# Patient Record
Sex: Male | Born: 1970 | Race: Black or African American | Hispanic: No | Marital: Single | Smoking: Current some day smoker
Health system: Southern US, Community
[De-identification: ages and names within clinical notes are randomized; demographics above are authoritative.]

## PROBLEM LIST (undated history)

## (undated) DIAGNOSIS — I639 Cerebral infarction, unspecified: Secondary | ICD-10-CM

## (undated) DIAGNOSIS — F32A Depression, unspecified: Secondary | ICD-10-CM

## (undated) DIAGNOSIS — F329 Major depressive disorder, single episode, unspecified: Secondary | ICD-10-CM

## (undated) DIAGNOSIS — G4730 Sleep apnea, unspecified: Secondary | ICD-10-CM

## (undated) DIAGNOSIS — F319 Bipolar disorder, unspecified: Secondary | ICD-10-CM

## (undated) DIAGNOSIS — D689 Coagulation defect, unspecified: Secondary | ICD-10-CM

## (undated) DIAGNOSIS — M1990 Unspecified osteoarthritis, unspecified site: Secondary | ICD-10-CM

## (undated) HISTORY — DX: Bipolar disorder, unspecified: F31.9

## (undated) HISTORY — DX: Cerebral infarction, unspecified: I63.9

## (undated) HISTORY — DX: Unspecified osteoarthritis, unspecified site: M19.90

## (undated) HISTORY — PX: EYE SURGERY: SHX253

## (undated) HISTORY — DX: Sleep apnea, unspecified: G47.30

## (undated) HISTORY — PX: OTHER SURGICAL HISTORY: SHX169

## (undated) HISTORY — DX: Coagulation defect, unspecified: D68.9

---

## 2005-06-11 ENCOUNTER — Ambulatory Visit: Payer: Self-pay

## 2009-03-02 ENCOUNTER — Emergency Department (HOSPITAL_COMMUNITY): Admission: EM | Admit: 2009-03-02 | Discharge: 2009-03-02 | Payer: Self-pay | Admitting: Family Medicine

## 2009-03-20 ENCOUNTER — Emergency Department (HOSPITAL_COMMUNITY): Admission: EM | Admit: 2009-03-20 | Discharge: 2009-03-20 | Payer: Self-pay | Admitting: Family Medicine

## 2009-04-12 ENCOUNTER — Emergency Department (HOSPITAL_COMMUNITY): Admission: EM | Admit: 2009-04-12 | Discharge: 2009-04-12 | Payer: Self-pay | Admitting: Emergency Medicine

## 2009-04-17 ENCOUNTER — Emergency Department (HOSPITAL_COMMUNITY): Admission: EM | Admit: 2009-04-17 | Discharge: 2009-04-17 | Payer: Self-pay | Admitting: Emergency Medicine

## 2009-11-29 ENCOUNTER — Emergency Department (HOSPITAL_COMMUNITY): Admission: EM | Admit: 2009-11-29 | Discharge: 2009-11-29 | Payer: Self-pay | Admitting: Family Medicine

## 2010-02-24 ENCOUNTER — Emergency Department (HOSPITAL_COMMUNITY): Admission: EM | Admit: 2010-02-24 | Discharge: 2010-02-24 | Payer: Self-pay | Admitting: Family Medicine

## 2011-01-26 LAB — FECAL LACTOFERRIN, QUANT: Fecal Lactoferrin: POSITIVE

## 2011-01-26 LAB — STOOL CULTURE

## 2011-02-19 LAB — POCT I-STAT, CHEM 8
BUN: 18 mg/dL (ref 6–23)
Creatinine, Ser: 1.3 mg/dL (ref 0.4–1.5)
Potassium: 4.2 mEq/L (ref 3.5–5.1)
Sodium: 138 mEq/L (ref 135–145)

## 2011-08-02 ENCOUNTER — Emergency Department (HOSPITAL_COMMUNITY)
Admission: EM | Admit: 2011-08-02 | Discharge: 2011-08-02 | Disposition: A | Discharge status: Home / Self Care | Payer: Medicare Other | Attending: Emergency Medicine | Admitting: Emergency Medicine

## 2011-08-02 ENCOUNTER — Emergency Department (HOSPITAL_COMMUNITY): Payer: Medicare Other

## 2011-08-02 DIAGNOSIS — W268XXA Contact with other sharp object(s), not elsewhere classified, initial encounter: Secondary | ICD-10-CM | POA: Insufficient documentation

## 2011-08-02 DIAGNOSIS — T148XXA Other injury of unspecified body region, initial encounter: Secondary | ICD-10-CM | POA: Insufficient documentation

## 2011-08-04 ENCOUNTER — Inpatient Hospital Stay (INDEPENDENT_AMBULATORY_CARE_PROVIDER_SITE_OTHER)
Admission: RE | Admit: 2011-08-04 | Discharge: 2011-08-04 | Disposition: A | Discharge status: Home / Self Care | Payer: Medicare Other | Source: Ambulatory Visit | Attending: Family Medicine | Admitting: Family Medicine

## 2011-08-04 DIAGNOSIS — T148XXA Other injury of unspecified body region, initial encounter: Secondary | ICD-10-CM

## 2011-08-09 ENCOUNTER — Inpatient Hospital Stay (INDEPENDENT_AMBULATORY_CARE_PROVIDER_SITE_OTHER)
Admission: RE | Admit: 2011-08-09 | Discharge: 2011-08-09 | Disposition: A | Discharge status: Home / Self Care | Payer: Medicare Other | Source: Ambulatory Visit | Attending: Emergency Medicine | Admitting: Emergency Medicine

## 2011-08-09 DIAGNOSIS — T148XXA Other injury of unspecified body region, initial encounter: Secondary | ICD-10-CM

## 2011-08-13 ENCOUNTER — Inpatient Hospital Stay (INDEPENDENT_AMBULATORY_CARE_PROVIDER_SITE_OTHER)
Admission: RE | Admit: 2011-08-13 | Discharge: 2011-08-13 | Disposition: A | Discharge status: Home / Self Care | Payer: Medicare Other | Source: Ambulatory Visit | Attending: Family Medicine | Admitting: Family Medicine

## 2011-08-13 DIAGNOSIS — Z4802 Encounter for removal of sutures: Secondary | ICD-10-CM

## 2015-05-07 ENCOUNTER — Encounter (HOSPITAL_COMMUNITY): Payer: Self-pay

## 2015-05-07 ENCOUNTER — Emergency Department (HOSPITAL_COMMUNITY)
Admission: EM | Admit: 2015-05-07 | Discharge: 2015-05-07 | Disposition: A | Discharge status: Home / Self Care | Payer: Medicare HMO | Attending: Emergency Medicine | Admitting: Emergency Medicine

## 2015-05-07 DIAGNOSIS — R55 Syncope and collapse: Secondary | ICD-10-CM | POA: Insufficient documentation

## 2015-05-07 DIAGNOSIS — R110 Nausea: Secondary | ICD-10-CM | POA: Diagnosis not present

## 2015-05-07 DIAGNOSIS — R0602 Shortness of breath: Secondary | ICD-10-CM | POA: Insufficient documentation

## 2015-05-07 DIAGNOSIS — F329 Major depressive disorder, single episode, unspecified: Secondary | ICD-10-CM | POA: Diagnosis not present

## 2015-05-07 DIAGNOSIS — Z720 Tobacco use: Secondary | ICD-10-CM | POA: Diagnosis not present

## 2015-05-07 DIAGNOSIS — Z79899 Other long term (current) drug therapy: Secondary | ICD-10-CM | POA: Diagnosis not present

## 2015-05-07 HISTORY — DX: Depression, unspecified: F32.A

## 2015-05-07 HISTORY — DX: Major depressive disorder, single episode, unspecified: F32.9

## 2015-05-07 LAB — BASIC METABOLIC PANEL
Anion gap: 5 (ref 5–15)
BUN: 15 mg/dL (ref 6–20)
CO2: 25 mmol/L (ref 22–32)
Calcium: 8.2 mg/dL — ABNORMAL LOW (ref 8.9–10.3)
Chloride: 108 mmol/L (ref 101–111)
Creatinine, Ser: 1.16 mg/dL (ref 0.61–1.24)
GFR calc Af Amer: >60 mL/min (ref >60)
GFR calc non Af Amer: >60 mL/min (ref >60)
Glucose, Bld: 79 mg/dL (ref 65–99)
Potassium: 4 mmol/L (ref 3.5–5.1)
Sodium: 138 mmol/L (ref 135–145)

## 2015-05-07 LAB — CBC WITH DIFFERENTIAL/PLATELET
Basophils Absolute: 0 10*3/uL (ref 0.0–0.1)
Basophils Relative: 0 % (ref 0–1)
Eosinophils Absolute: 0.1 10*3/uL (ref 0.0–0.7)
Eosinophils Relative: 2 % (ref 0–5)
HCT: 41 % (ref 39.0–52.0)
Hemoglobin: 13.6 g/dL (ref 13.0–17.0)
Lymphocytes Relative: 35 % (ref 12–46)
Lymphs Abs: 2 10*3/uL (ref 0.7–4.0)
MCH: 30.8 pg (ref 26.0–34.0)
MCHC: 33.2 g/dL (ref 30.0–36.0)
MCV: 92.8 fL (ref 78.0–100.0)
Monocytes Absolute: 0.6 10*3/uL (ref 0.1–1.0)
Monocytes Relative: 9 % (ref 3–12)
Neutro Abs: 3.1 10*3/uL (ref 1.7–7.7)
Neutrophils Relative %: 54 % (ref 43–77)
Platelets: 155 10*3/uL (ref 150–400)
RBC: 4.42 MIL/uL (ref 4.22–5.81)
RDW: 13.1 % (ref 11.5–15.5)
WBC: 5.8 10*3/uL (ref 4.0–10.5)

## 2015-05-07 LAB — MAGNESIUM: Magnesium: 2 mg/dL (ref 1.7–2.4)

## 2015-05-07 LAB — TROPONIN I: Troponin I: <0.03 ng/mL (ref <0.031)

## 2015-05-07 MED ORDER — SODIUM CHLORIDE 0.9 % IV BOLUS (SEPSIS)
1000.0000 mL | Freq: Once | INTRAVENOUS | Status: AC
  Administered 2015-05-07: 1000 mL via INTRAVENOUS

## 2015-05-07 NOTE — ED Notes (Signed)
Patient c/o having dizziness x 1 year and is now having syncopal episodes x 4 days. Patient states he starts to get nauseated, SOB, lightheaded and then passes out. Patient denies any CP. Patient also c/o dysuria and states tht he is having bilateral flank pain and bilateral lower abdominal pain.

## 2015-05-07 NOTE — Discharge Instructions (Signed)
Syncope °Syncope means a person passes out (faints). The person usually wakes up in less than 5 minutes. It is important to seek medical care for syncope. °HOME CARE °· Have someone stay with you until you feel normal. °· Do not drive, use machines, or play sports until your doctor says it is okay. °· Keep all doctor visits as told. °· Lie down when you feel like you might pass out. Take deep breaths. Wait until you feel normal before standing up. °· Drink enough fluids to keep your pee (urine) clear or pale yellow. °· If you take blood pressure or heart medicine, get up slowly. Take several minutes to sit and then stand. °GET HELP RIGHT AWAY IF:  °· You have a severe headache. °· You have pain in the chest, belly (abdomen), or back. °· You are bleeding from the mouth or butt (rectum). °· You have black or tarry poop (stool). °· You have an irregular or very fast heartbeat. °· You have pain with breathing. °· You keep passing out, or you have shaking (seizures) when you pass out. °· You pass out when sitting or lying down. °· You feel confused. °· You have trouble walking. °· You have severe weakness. °· You have vision problems. °If you fainted, call your local emergency services (911 in [place] Do not drive yourself to the hospital. °MAKE SURE YOU:  °· Understand these instructions. °· Will watch your condition. °· Will get help right away if you are not doing well or get worse. °Document Released: 04/14/2008 Document Revised: 04/27/2012 Document Reviewed: 12/26/2011 °Exit[employer]® Patient Information ©2015 [employer]. This information is not intended to replace advice given to you by your health care provider. Make sure you discuss any questions you have with your health care provider. ° °

## 2015-05-09 NOTE — ED Provider Notes (Signed)
CSN: [id number]     Arrival date & time 05/07/15  [id number] History   First MD Initiated Contact with Patient 05/07/15 [phone]     Chief Complaint  Patient presents with  . Loss of Consciousness     (Consider location/radiation/quality/duration/timing/severity/associated sxs/prior Treatment) HPI   44 year old male with syncope. Patient has had numerous syncopal events in the past year. Most recent episode was today. Typical of prior episodes. He felt nauseated, lightheaded as if he may pass down and actually did have a brief loss of consciousness. No incontinence, but states he has been incontinent with similar prior episodes. Denies any chest pain. Mild shortness of breath currently. No fevers or chills. Denies any drug use. Is a smoker. Has never had formal evaluation of syncope.  Past Medical History  Diagnosis Date  . Depression    Past Surgical History  Procedure Laterality Date  . Eye surgery    . Head surgery     History reviewed. No pertinent family history. History  Substance Use Topics  . Smoking status: Current Every Day Smoker -- 0.15 packs/day    Types: Cigarettes  . Smokeless tobacco: Never Used  . Alcohol Use: Yes     Comment: 2 times a week    Review of Systems  All systems reviewed and negative, other than as noted in HPI.   Allergies  Review of patient's allergies indicates no known allergies.  Home Medications   Prior to Admission medications   Medication Sig Start Date End Date Taking? Authorizing Provider  FLUoxetine (PROZAC) 20 MG capsule Take 20 mg by mouth daily. 03/14/15  Yes Historical Provider, MD  traZODone (DESYREL) 50 MG tablet Take 50 mg by mouth at bedtime as needed for sleep.  03/14/15  Yes Historical Provider, MD   BP 140/84 mmHg  Pulse 73  Temp(Src) 98.6 F (37 C) (Oral)  Resp 23  SpO2 99% Physical Exam  Constitutional: He appears well-developed and well-nourished. No distress.  HENT:  Head: Normocephalic and atraumatic.  Eyes:  Conjunctivae are normal. Right eye exhibits no discharge. Left eye exhibits no discharge.  Neck: Neck supple.  Cardiovascular: Normal rate, regular rhythm and normal heart sounds.  Exam reveals no gallop and no friction rub.   No murmur heard. Pulmonary/Chest: Effort normal and breath sounds normal. No respiratory distress.  Abdominal: Soft. He exhibits no distension. There is no tenderness.  Musculoskeletal: He exhibits no edema or tenderness.  Neurological: He is alert.  Skin: Skin is warm and dry.  Psychiatric: He has a normal mood and affect. His behavior is normal. Thought content normal.  Nursing note and vitals reviewed.   ED Course  Procedures (including critical care time) Labs Review Labs Reviewed  BASIC METABOLIC PANEL - Abnormal; Notable for the following:    Calcium 8.2 (*)    All other components within normal limits  CBC WITH DIFFERENTIAL/PLATELET  TROPONIN I  MAGNESIUM    Imaging Review No results found.   EKG Interpretation   Date/Time:  Monday May 07 2015 12:25:15 EDT Ventricular Rate:  79 PR Interval:  141 QRS Duration: 89 QT Interval:  364 QTC Calculation: 417 R Axis:   -110 Text Interpretation:  Sinus rhythm Non-specific ST-t changes Confirmed by  Juleen ChinaKOHUT  MD, Tremond Shimabukuro ([id number]) on 05/07/2015 1:11:24 PM      MDM   Final diagnoses:  Syncope, unspecified syncope type    44 year old male with syncope. Afebrile. Nontoxic. Hemodynamically stable. Workup has been fairly unremarkable. He has had recurrent syncope. This  is an ongoing for over a year though. He is further workup, but I do not feel he needs admission at this time. He is to establish PCP. Cardiology follow-up information was provided as well.    Raeford Razor, MD 05/09/15 [phone]

## 2015-05-11 ENCOUNTER — Encounter: Payer: Self-pay | Admitting: Cardiovascular Disease

## 2015-05-11 ENCOUNTER — Ambulatory Visit (INDEPENDENT_AMBULATORY_CARE_PROVIDER_SITE_OTHER): Payer: Medicare HMO | Admitting: Cardiovascular Disease

## 2015-05-11 ENCOUNTER — Ambulatory Visit: Payer: Medicare Other | Admitting: Cardiovascular Disease

## 2015-05-11 VITALS — BP 100/72 | HR 80 | Ht 67.0 in | Wt 212.0 lb

## 2015-05-11 DIAGNOSIS — F319 Bipolar disorder, unspecified: Secondary | ICD-10-CM | POA: Diagnosis not present

## 2015-05-11 DIAGNOSIS — R110 Nausea: Secondary | ICD-10-CM

## 2015-05-11 DIAGNOSIS — R42 Dizziness and giddiness: Secondary | ICD-10-CM | POA: Diagnosis not present

## 2015-05-11 DIAGNOSIS — R55 Syncope and collapse: Secondary | ICD-10-CM | POA: Diagnosis not present

## 2015-05-11 DIAGNOSIS — Z87898 Personal history of other specified conditions: Secondary | ICD-10-CM

## 2015-05-11 DIAGNOSIS — F1910 Other psychoactive substance abuse, uncomplicated: Secondary | ICD-10-CM

## 2015-05-11 DIAGNOSIS — Z9289 Personal history of other medical treatment: Secondary | ICD-10-CM

## 2015-05-11 NOTE — Patient Instructions (Addendum)
Your physician has recommended that you wear a 30 day event monitor. Event monitors are medical devices that record the heart's electrical activity. Doctors most often us these monitors to diagnose arrhythmias. Arrhythmias are problems with the speed or rhythm of the heartbeat. The monitor is a small, portable device. You can wear one while you do your normal daily activities. This is usually used to diagnose what is causing palpitations/syncope (passing out). MRI of brain with contrast Office will contact with results via phone or letter.   Continue all current medications. Follow up in  6 weeks

## 2015-05-11 NOTE — Addendum Note (Signed)
Addended by: Lesle ChrisHILL, Harshal Sirmon G on: 05/11/2015 11:04 AM   Modules accepted: Level of Service

## 2015-05-11 NOTE — Progress Notes (Signed)
Patient ID: Charles Johnson, male   DOB: [birth year], 44 y.o.   MRN: [id number]       CARDIOLOGY CONSULT NOTE  Patient ID: Charles Johnson MRN: [id number] DOB/AGE: Apr 10, 1971 44 y.o.  Admit date: (Not on file) Primary Physician No primary care provider on file.  Reason for Consultation: syncope  HPI: The patient is a 44 year old male who is been referred for the evaluation of syncope. He was recently evaluated in the ED for this. He has reportedly had several episodes of syncope, never associated with bladder or bowel incontinence. He denies a history of seizures. Labs on 6/27 demonstrated normal CBC, troponin, and magnesium, with a relatively normal basic metabolic panel well. ECG demonstrated normal sinus rhythm with a nonspecific ST segment and T-wave abnormality in the inferior leads.  He said he has been passing out twice per month for the past year. He endorses antecedent nausea, dizziness, and cold sweats. He denies a history of exertional chest pain, shortness of breath, and palpitations.   His mother has power of attorney and he lives with her. She told me outside of the room that he has bipolar disorder and tobacco abuse. He said he experiences nausea twice per week. He said he has constant dizziness.  Soc: Smokes cigarettes and uses cocaine.   No Known Allergies  Current Outpatient Prescriptions  Medication Sig Dispense Refill  . FLUoxetine (PROZAC) 20 MG capsule Take 20 mg by mouth daily.    . traZODone (DESYREL) 50 MG tablet Take 50 mg by mouth at bedtime as needed for sleep.      No current facility-administered medications for this visit.    Past Medical History  Diagnosis Date  . Depression     Past Surgical History  Procedure Laterality Date  . Eye surgery    . Head surgery      History   Social History  . Marital Status: Single    Spouse Name: N/A  . Number of Children: N/A  . Years of Education: N/A   Occupational History  . Not on file.    Social History Main Topics  . Smoking status: Current Every Day Smoker -- 0.50 packs/day    Types: Cigarettes  . Smokeless tobacco: Never Used  . Alcohol Use: 0.0 oz/week    0 Standard drinks or equivalent per week     Comment: 2 times a week  . Drug Use: Yes    Special: Marijuana     Comment: occasionally  . Sexual Activity: Not on file   Other Topics Concern  . Not on file   Social History Narrative     No family history of premature CAD in 1st degree relatives.  Prior to Admission medications   Medication Sig Start Date End Date Taking? Authorizing Provider  FLUoxetine (PROZAC) 20 MG capsule Take 20 mg by mouth daily. 03/14/15  Yes Historical Provider, MD  traZODone (DESYREL) 50 MG tablet Take 50 mg by mouth at bedtime as needed for sleep.  03/14/15  Yes Historical Provider, MD     Review of systems complete and found to be negative unless listed above in HPI     Physical exam Blood pressure 100/72, pulse 80, height  (1.702 m), weight 212 lb (96.163 kg), SpO2 98 %. General: NAD Neck: No JVD, no thyromegaly or thyroid nodule.  Lungs: Clear to auscultation bilaterally with normal respiratory effort. CV: Nondisplaced PMI. Regular rate and rhythm, normal S1/S2, no S3/S4, no murmur.  No peripheral edema.  No carotid bruit.  Normal pedal pulses.  Abdomen: Soft, nontender, no hepatosplenomegaly, no distention.  Skin: Intact without lesions or rashes.  Neurologic: Alert and oriented x 3.  Psych: Normal affect. Extremities: No clubbing or cyanosis.  HEENT: Normal.   ECG: Most recent ECG reviewed.  Labs:   Lab Results  Component Value Date   WBC 5.8 05/07/2015   HGB 13.6 05/07/2015   HCT 41.0 05/07/2015   MCV 92.8 05/07/2015   PLT 155 05/07/2015    Recent Labs Lab 05/07/15 1057  NA 138  K 4.0  CL 108  CO2 25  BUN 15  CREATININE 1.16  CALCIUM 8.2*  GLUCOSE 79   Lab Results  Component Value Date   TROPONINI <0.03 05/07/2015   No results found for:  CHOL No results found for: HDL No results found for: LDLCALC No results found for: TRIG No results found for: CHOLHDL No results found for: LDLDIRECT       Studies: No results found.  ASSESSMENT AND PLAN:  1. Syncope: Unclear etiology. Will obtain brain MRI to evaluate for a neurologic cause. Will obtain a 30 day event monitor to rule out arrhythmias and sinus pauses. Orthostatics checked today and were normal.  2. Bipolar disorder: On meds.  3. Polysubstance abuse disorder  Dispo: f/u 6 weeks.   Signed: Prentice DockerSuresh Selena Swaminathan, M.D., F.A.C.C.  05/11/2015, 10:18 AM

## 2015-05-16 ENCOUNTER — Other Ambulatory Visit: Payer: Self-pay | Admitting: *Deleted

## 2015-05-16 DIAGNOSIS — R42 Dizziness and giddiness: Secondary | ICD-10-CM

## 2015-05-16 DIAGNOSIS — R110 Nausea: Secondary | ICD-10-CM

## 2015-05-17 ENCOUNTER — Encounter (INDEPENDENT_AMBULATORY_CARE_PROVIDER_SITE_OTHER): Payer: Medicare HMO

## 2015-05-17 DIAGNOSIS — R55 Syncope and collapse: Secondary | ICD-10-CM | POA: Diagnosis not present

## 2015-05-18 ENCOUNTER — Encounter: Payer: Self-pay | Admitting: Cardiovascular Disease

## 2015-05-28 ENCOUNTER — Ambulatory Visit (INDEPENDENT_AMBULATORY_CARE_PROVIDER_SITE_OTHER): Payer: Medicare HMO | Admitting: Family

## 2015-05-28 ENCOUNTER — Encounter: Payer: Self-pay | Admitting: Family

## 2015-05-28 VITALS — BP 108/72 | HR 71 | Temp 98.3°F | Resp 18 | Ht 67.0 in | Wt 218.0 lb

## 2015-05-28 DIAGNOSIS — R55 Syncope and collapse: Secondary | ICD-10-CM | POA: Diagnosis not present

## 2015-05-28 DIAGNOSIS — K649 Unspecified hemorrhoids: Secondary | ICD-10-CM

## 2015-05-28 DIAGNOSIS — F3160 Bipolar disorder, current episode mixed, unspecified: Secondary | ICD-10-CM | POA: Diagnosis not present

## 2015-05-28 NOTE — Assessment & Plan Note (Signed)
Syncope of undetermined origin, cannot rule out previous head trauma. Continue with planned MRI and cardiology workup. Follow up pending results or sooner if symptoms occur again.

## 2015-05-28 NOTE — Assessment & Plan Note (Signed)
Stable with current regimen of prozac and trazadone. Takes medication as prescribed and denies adverse side effects. Continue current dosages of prozac and trazadone.

## 2015-05-28 NOTE — Assessment & Plan Note (Signed)
Stable with current OTC treatment. Discussed importance of avoiding constipation through increased fluid, fiber and fluid intake. Discussed colace to help with stool softening. Has additional follow up appointment scheduled.

## 2015-05-28 NOTE — Patient Instructions (Signed)
Thank you for choosing [facility].  Summary/Instructions:  If your symptoms worsen or fail to improve, please contact our office for further instruction, or in case of emergency go directly to the emergency room at the closest medical facility.   Syncope Syncope is a medical term for fainting or passing out. This means you lose consciousness and drop to the ground. People are generally unconscious for less than 5 minutes. You may have some muscle twitches for up to 15 seconds before waking up and returning to normal. Syncope occurs more often in older adults, but it can happen to anyone. While most causes of syncope are not dangerous, syncope can be a sign of a serious medical problem. It is important to seek medical care.  CAUSES  Syncope is caused by a sudden drop in blood flow to the brain. The specific cause is often not determined. Factors that can bring on syncope include:  Taking medicines that lower blood pressure.  Sudden changes in posture, such as standing up quickly.  Taking more medicine than prescribed.  Standing in one place for too long.  Seizure disorders.  Dehydration and excessive exposure to heat.  Low blood sugar (hypoglycemia).  Straining to have a bowel movement.  Heart disease, irregular heartbeat, or other circulatory problems.  Fear, emotional distress, seeing blood, or severe pain. SYMPTOMS  Right before fainting, you may:  Feel dizzy or light-headed.  Feel nauseous.  See all white or all black in your field of vision.  Have cold, clammy skin. DIAGNOSIS  Your health care provider will ask about your symptoms, perform a physical exam, and perform an electrocardiogram (ECG) to record the electrical activity of your heart. Your health care provider may also perform other heart or blood tests to determine the cause of your syncope which may include:  Transthoracic echocardiogram (TTE). During echocardiography, sound waves are used to evaluate  how blood flows through your heart.  Transesophageal echocardiogram (TEE).  Cardiac monitoring. This allows your health care provider to monitor your heart rate and rhythm in real time.  Holter monitor. This is a portable device that records your heartbeat and can help diagnose heart arrhythmias. It allows your health care provider to track your heart activity for several days, if needed.  Stress tests by exercise or by giving medicine that makes the heart beat faster. TREATMENT  In most cases, no treatment is needed. Depending on the cause of your syncope, your health care provider may recommend changing or stopping some of your medicines. HOME CARE INSTRUCTIONS  Have someone stay with you until you feel stable.  Do not drive, use machinery, or play sports until your health care provider says it is okay.  Keep all follow-up appointments as directed by your health care provider.  Lie down right away if you start feeling like you might faint. Breathe deeply and steadily. Wait until all the symptoms have passed.  Drink enough fluids to keep your urine clear or pale yellow.  If you are taking blood pressure or heart medicine, get up slowly and take several minutes to sit and then stand. This can reduce dizziness. SEEK IMMEDIATE MEDICAL CARE IF:   You have a severe headache.  You have unusual pain in the chest, abdomen, or back.  You are bleeding from your mouth or rectum, or you have black or tarry stool.  You have an irregular or very fast heartbeat.  You have pain with breathing.  You have repeated fainting or seizure-like jerking during an  episode.  You faint when sitting or lying down.  You have confusion.  You have trouble walking.  You have severe weakness.  You have vision problems. If you fainted, call your local emergency services (911 in [place] Do not drive yourself to the hospital.  MAKE SURE YOU:  Understand these instructions.  Will watch your  condition.  Will get help right away if you are not doing well or get worse. Document Released: 10/27/2005 Document Revised: 11/01/2013 Document Reviewed: 12/26/2011 [facility] Patient Information 2015 [place], [place]. This information is not intended to replace advice given to you by your health care provider. Make sure you discuss any questions you have with your health care provider.  Hemorrhoids Hemorrhoids are swollen veins around the rectum or anus. There are two types of hemorrhoids:   Internal hemorrhoids. These occur in the veins just inside the rectum. They may poke through to the outside and become irritated and painful.  External hemorrhoids. These occur in the veins outside the anus and can be felt as a painful swelling or hard lump near the anus. CAUSES  Pregnancy.   Obesity.   Constipation or diarrhea.   Straining to have a bowel movement.   Sitting for long periods on the toilet.  Heavy lifting or other activity that caused you to strain.  Anal intercourse. SYMPTOMS   Pain.   Anal itching or irritation.   Rectal bleeding.   Fecal leakage.   Anal swelling.   One or more lumps around the anus.  DIAGNOSIS  Your caregiver may be able to diagnose hemorrhoids by visual examination. Other examinations or tests that may be performed include:   Examination of the rectal area with a gloved hand (digital rectal exam).   Examination of anal canal using a small tube (scope).   A blood test if you have lost a significant amount of blood.  A test to look inside the colon (sigmoidoscopy or colonoscopy). TREATMENT Most hemorrhoids can be treated at home. However, if symptoms do not seem to be getting better or if you have a lot of rectal bleeding, your caregiver may perform a procedure to help make the hemorrhoids get smaller or remove them completely. Possible treatments include:   Placing a rubber band at the base of the hemorrhoid to cut off the  circulation (rubber band ligation).   Injecting a chemical to shrink the hemorrhoid (sclerotherapy).   Using a tool to burn the hemorrhoid (infrared light therapy).   Surgically removing the hemorrhoid (hemorrhoidectomy).   Stapling the hemorrhoid to block blood flow to the tissue (hemorrhoid stapling).  HOME CARE INSTRUCTIONS   Eat foods with fiber, such as whole grains, beans, nuts, fruits, and vegetables. Ask your doctor about taking products with added fiber in them (fibersupplements).  Increase fluid intake. Drink enough water and fluids to keep your urine clear or pale yellow.   Exercise regularly.   Go to the bathroom when you have the urge to have a bowel movement. Do not wait.   Avoid straining to have bowel movements.   Keep the anal area dry and clean. Use wet toilet paper or moist towelettes after a bowel movement.   Medicated creams and suppositories may be used or applied as directed.   Only take over-the-counter or prescription medicines as directed by your caregiver.   Take warm sitz baths for 15-20 minutes, 3-4 times a day to ease pain and discomfort.   Place ice packs on the hemorrhoids if they are tender and swollen.  Using ice packs between sitz baths may be helpful.   Put ice in a plastic bag.   Place a towel between your skin and the bag.   Leave the ice on for 15-20 minutes, 3-4 times a day.   Do not use a donut-shaped pillow or sit on the toilet for long periods. This increases blood pooling and pain.  SEEK MEDICAL CARE IF:  You have increasing pain and swelling that is not controlled by treatment or medicine.  You have uncontrolled bleeding.  You have difficulty or you are unable to have a bowel movement.  You have pain or inflammation outside the area of the hemorrhoids. MAKE SURE YOU:  Understand these instructions.  Will watch your condition.  Will get help right away if you are not doing well or get worse. Document  Released: 10/24/2000 Document Revised: 10/13/2012 Document Reviewed: 08/31/2012 [facility] Patient Information 2015 [place], [place]. This information is not intended to replace advice given to you by your health care provider. Make sure you discuss any questions you have with your health care provider.

## 2015-05-28 NOTE — Progress Notes (Signed)
Pre visit review using our clinic review tool, if applicable. No additional management support is needed unless otherwise documented below in the visit note. 

## 2015-05-28 NOTE — Progress Notes (Signed)
Subjective:    Patient ID: Charles Johnson, male    DOB: May 01, 1971, 44 y.o.   MRN: [id number]  Chief Complaint  Patient presents with  . Establish Care    HPI:  Charles Johnson is a 44 y.o. male with a PMH of bipolar, syncope and hemorrhoids who presents today for an office visit to establish care.     1.) Syncope - Associated symptom of occasional syncope has been going on/off for about 1 year. Occurs sporadically and described that it occurs when he gets up real fast. There is also associated nausea before he passes out. Has not had any episodes of syncope.  Most recent episode was followed in the [facility]. EKG revealed normal sinus rhythm and negative troponins. He followed up with cardiology who has ordered an MRI to evaluate neurological cause and is obtaining a 30 day event monitor. Orthostatics in the office were normal. All ED and cardiology notes were revealed in detail.   2.) Hemorrhoids - Associated symptom of bleeding located in his rectum has been going on for about 4 years. Denies any treatments. Reports occasional constipation with hard stools. Reports that his symptoms have been improved lately.   3.) Bipolar - Indicates stable with current regimen of Proazc and trazadone. Takes the medication as prescribed and denies adverse effects.   No Known Allergies   Outpatient Prescriptions Prior to Visit  Medication Sig Dispense Refill  . FLUoxetine (PROZAC) 20 MG capsule Take 20 mg by mouth daily.    . traZODone (DESYREL) 50 MG tablet Take 50 mg by mouth at bedtime as needed for sleep.      No facility-administered medications prior to visit.     Past Medical History  Diagnosis Date  . Depression   . Bipolar 1 disorder      Past Surgical History  Procedure Laterality Date  . Head surgery    . Eye surgery      Traumatic     Family History  Problem Relation Age of Onset  . Hypertension Maternal Uncle   . Heart disease Maternal Grandmother   . Hypertension  Maternal Grandmother   . Diabetes Maternal Grandmother   . Heart failure Maternal Grandmother      History   Social History  . Marital Status: Single    Spouse Name: N/A  . Number of Children: 1  . Years of Education: 12   Occupational History  . Disability     Bipolar   Social History Main Topics  . Smoking status: Current Every Day Smoker -- 0.50 packs/day for 30 years    Types: Cigarettes  . Smokeless tobacco: Never Used  . Alcohol Use: 0.0 oz/week    0 Standard drinks or equivalent per week     Comment: 2 times a week  . Drug Use: Yes    Special: Marijuana     Comment: occasionally  . Sexual Activity: Not on file   Other Topics Concern  . Not on file   Social History Narrative   Fun: Read   Denies religious beliefs effecting health care.     Review of Systems  Constitutional: Negative for fever and chills.  Respiratory: Negative for chest tightness and shortness of breath.   Cardiovascular: Negative for chest pain, palpitations and leg swelling.  Genitourinary: Positive for flank pain.  Neurological: Negative for dizziness, seizures, weakness, light-headedness and headaches.      Objective:    BP 108/72 mmHg  Pulse 71  Temp(Src) 98.3  F (36.8 C) (Oral)  Resp 18  Ht  (1.702 m)  Wt 218 lb (98.884 kg)  BMI 34.14 kg/m2  SpO2 97% Nursing note and vital signs reviewed.  Physical Exam  Constitutional: He is oriented to person, place, and time. He appears well-developed and well-nourished. No distress.  Cardiovascular: Normal rate, regular rhythm, normal heart sounds and intact distal pulses.   Pulmonary/Chest: Effort normal and breath sounds normal.  Neurological: He is alert and oriented to person, place, and time.  Skin: Skin is warm and dry.  Psychiatric: He has a normal mood and affect. His behavior is normal. Judgment and thought content normal.       Assessment & Plan:   Problem List Items Addressed This Visit      Cardiovascular and  Mediastinum   Syncope - Primary (Chronic)    Syncope of undetermined origin, cannot rule out previous head trauma. Continue with planned MRI and cardiology workup. Follow up pending results or sooner if symptoms occur again.       Hemorrhoids    Stable with current OTC treatment. Discussed importance of avoiding constipation through increased fluid, fiber and fluid intake. Discussed colace to help with stool softening. Has additional follow up appointment scheduled.         Other   Mixed bipolar I disorder    Stable with current regimen of prozac and trazadone. Takes medication as prescribed and denies adverse side effects. Continue current dosages of prozac and trazadone.

## 2015-05-30 ENCOUNTER — Ambulatory Visit (HOSPITAL_COMMUNITY)
Admission: RE | Admit: 2015-05-30 | Discharge: 2015-05-30 | Disposition: A | Discharge status: Home / Self Care | Payer: Medicare HMO | Source: Ambulatory Visit | Attending: Cardiovascular Disease | Admitting: Cardiovascular Disease

## 2015-05-30 ENCOUNTER — Other Ambulatory Visit (HOSPITAL_COMMUNITY): Payer: Commercial Managed Care - HMO

## 2015-05-30 DIAGNOSIS — R55 Syncope and collapse: Secondary | ICD-10-CM | POA: Diagnosis present

## 2015-05-30 DIAGNOSIS — R42 Dizziness and giddiness: Secondary | ICD-10-CM | POA: Diagnosis not present

## 2015-05-30 DIAGNOSIS — R110 Nausea: Secondary | ICD-10-CM | POA: Insufficient documentation

## 2015-06-01 ENCOUNTER — Telehealth: Payer: Self-pay | Admitting: Family

## 2015-06-01 NOTE — Telephone Encounter (Signed)
Received record request from [facility] for Dr. Carver Fila advising patient's records are in [facility] 06/01/15 fbg.

## 2015-06-06 ENCOUNTER — Telehealth: Payer: Self-pay | Admitting: Cardiovascular Disease

## 2015-06-06 NOTE — Telephone Encounter (Signed)
Mrs. Milbourne called asking for [employer] States that she called on 06-05-15.

## 2015-06-08 NOTE — Telephone Encounter (Signed)
Notes Recorded by Lesle Chris, LPN on 1/61/0960 at 9:02 AM Patient made aware. Will change appointment to 06/28/2015 at 3:30 with Dr. Graciela Husbands in our [facility] office.

## 2015-06-18 ENCOUNTER — Telehealth: Payer: Self-pay | Admitting: Family

## 2015-06-18 ENCOUNTER — Ambulatory Visit: Payer: Medicare HMO | Admitting: Family

## 2015-06-18 NOTE — Telephone Encounter (Signed)
May reschedule if he calls back. 1st no show.

## 2015-06-18 NOTE — Telephone Encounter (Signed)
Patient no showed for appointment today 8/8.  Please advise

## 2015-06-19 NOTE — Telephone Encounter (Signed)
Noted  

## 2015-06-20 ENCOUNTER — Telehealth: Payer: Self-pay | Admitting: *Deleted

## 2015-06-20 NOTE — Telephone Encounter (Signed)
Patient's mother informed

## 2015-06-28 ENCOUNTER — Institutional Professional Consult (permissible substitution): Payer: Medicare HMO | Admitting: Internal Medicine

## 2015-06-29 ENCOUNTER — Ambulatory Visit: Payer: Commercial Managed Care - HMO | Admitting: Cardiovascular Disease

## 2015-07-11 ENCOUNTER — Ambulatory Visit: Payer: Medicare Other | Admitting: Cardiovascular Disease

## 2015-07-24 ENCOUNTER — Institutional Professional Consult (permissible substitution): Payer: Medicare HMO | Admitting: Internal Medicine

## 2015-07-25 ENCOUNTER — Encounter: Payer: Self-pay | Admitting: Internal Medicine

## 2015-08-13 ENCOUNTER — Institutional Professional Consult (permissible substitution): Payer: Medicare HMO | Admitting: Internal Medicine

## 2015-09-14 ENCOUNTER — Ambulatory Visit (INDEPENDENT_AMBULATORY_CARE_PROVIDER_SITE_OTHER): Payer: Medicare HMO | Admitting: Internal Medicine

## 2015-09-14 ENCOUNTER — Encounter: Payer: Self-pay | Admitting: Internal Medicine

## 2015-09-14 VITALS — BP 120/82 | HR 79 | Ht 67.5 in | Wt 212.2 lb

## 2015-09-14 DIAGNOSIS — R55 Syncope and collapse: Secondary | ICD-10-CM

## 2015-09-14 NOTE — Progress Notes (Signed)
ELECTROPHYSIOLOGY CONSULT NOTE  Patient ID: Charles Johnson, MRN: [id number], DOB/AGE: 1971-10-07 44 y.o. Admit date: (Not on file) Date of Consult: 09/14/2015  Primary Physician: Jeanine Luz, FNP Primary Cardiologist: SK  Chief Complaint: syncope   HPI Charles Johnson is a 44 y.o. male *seen for syncope  He has a history of recurrent events with a stereotypical prodrome characterized by nausea, flushing, diaphoresis and dizziness. There is residual orthostatic intolerance. He has had a half dozen to a dozen episodes over the last couple of years.  While he uses marijuana and EtOH  He does not particularly associate use with these events.   He denies exercise intolerance. He has had no chest pain. Cardiac evaluation has been scant. An ECG demonstrated right axis deviation.    Past Medical History  Diagnosis Date  . Depression   . Bipolar 1 disorder [facility])       Surgical History:  Past Surgical History  Procedure Laterality Date  . Head surgery    . Eye surgery      Traumatic     Home Meds: Prior to Admission medications   Medication Sig Start Date End Date Taking? Authorizing Provider  FLUoxetine (PROZAC) 20 MG capsule Take 20 mg by mouth daily. 03/14/15  Yes Historical Provider, MD  traZODone (DESYREL) 50 MG tablet Take 50 mg by mouth at bedtime as needed for sleep.  03/14/15  Yes Historical Provider, MD    Allergies: No Known Allergies  Social History   Social History  . Marital Status: Single    Spouse Name: N/A  . Number of Children: 1  . Years of Education: 12   Occupational History  . Disability     Bipolar   Social History Main Topics  . Smoking status: Current Every Day Smoker -- 0.50 packs/day for 30 years    Types: Cigarettes  . Smokeless tobacco: Never Used  . Alcohol Use: 0.0 oz/week    0 Standard drinks or equivalent per week     Comment: 2 times a week  . Drug Use: Yes    Special: Marijuana     Comment: occasionally  . Sexual  Activity: Not on file   Other Topics Concern  . Not on file   Social History Narrative   Fun: Read   Denies religious beliefs effecting health care.      Family History  Problem Relation Age of Onset  . Hypertension Maternal Uncle   . Heart disease Maternal Grandmother   . Hypertension Maternal Grandmother   . Diabetes Maternal Grandmother   . Heart failure Maternal Grandmother      ROS:  Please see the history of present illness.     All other systems reviewed and negative.    Physical Exam:   Blood pressure 120/82, pulse 79, height 5' 7.5" (1.715 m), weight 212 lb 3.2 oz (96.253 kg). General: Well developed, well nourished male in no acute distress. Head: Normocephalic, atraumatic, sclera non-icteric, no xanthomas, nares are without discharge. EENT: normal  Lymph Nodes:  none Neck: Negative for carotid bruits. JVD not elevated. Back:without scoliosis kyphosis  Lungs: Clear bilaterally to auscultation without wheezes, rales, or rhonchi. Breathing is unlabored. Heart: RRR with S1 S2. No   murmur . No rubs, or gallops appreciated. Abdomen: Soft, non-tender, non-distended with normoactive bowel sounds. No hepatomegaly. No rebound/guarding. No obvious abdominal masses. Msk:  Strength and tone appear normal for age. Extremities: No clubbing or cyanosis. No* * edema.  Distal  pedal pulses are 2+ and equal bilaterally. Skin: Warm and Dry Neuro: Alert and oriented X 3. CN III-XII intact Grossly normal sensory and motor function . Psych:  Responds to questions appropriately with a normal affect.      Labs: Cardiac Enzymes No results for input(s): CKTOTAL, CKMB, TROPONINI in the last 72 hours. CBC Lab Results  Component Value Date   WBC 5.8 05/07/2015   HGB 13.6 05/07/2015   HCT 41.0 05/07/2015   MCV 92.8 05/07/2015   PLT 155 05/07/2015   PROTIME: No results for input(s): LABPROT, INR in the last 72 hours. Chemistry No results for input(s): NA, K, CL, CO2, BUN,  CREATININE, CALCIUM, PROT, BILITOT, ALKPHOS, ALT, AST, GLUCOSE in the last 168 hours.  Invalid input(s): LABALBU Lipids No results found for: CHOL, HDL, LDLCALC, TRIG BNP No results found for: PROBNP Thyroid Function Tests: No results for input(s): TSH, T4TOTAL, T3FREE, THYROIDAB in the last 72 hours.  Invalid input(s): FREET3 Miscellaneous No results found for: DDIMER  Radiology/Studies:  No results found.  EKG:    ECG 6/16 demonstrated right axis deviation and R prime in lead V1 and otherwise normal intervals   Assessment and Plan:   Neurally mediated syncope  His symptoms are most consistent with neurally mediated syncope with a stereotypical prodrome and residual orthostatic intolerance. His ECG obtained in June was modestly abnormal. He has no exercise intolerance but still at his age is important to exclude a Bezold-Jarisch reflex so will undertake Myoview scanning  We discussed extensively the issues of dysautonomia, the physiology of orthstasis and positional stress.  We discussed the role of salt and water repletion, the importance of exercise, often needing to be started in the recumbent position, and the awareness of triggers and the role of ambient heat and dehydration     Sherryl MangesSteven Klein

## 2015-09-14 NOTE — Patient Instructions (Addendum)
Medication Instructions: - no changes  Labwork: - none  Procedures/Testing: - Your physician has requested that you have an exercise stress myoview. For further information please visit [email or web address]. Please follow instruction sheet, as given.  Follow-Up: - Your physician wants you to follow-up in: 6 months with Dr. Graciela HusbandsKlein. You will receive a reminder letter in the mail two months in advance. If you don't receive a letter, please call our office to schedule the follow-up appointment.  Any Additional Special Instructions Will Be Listed Below (If Applicable). - none

## 2015-09-18 ENCOUNTER — Encounter (HOSPITAL_COMMUNITY): Payer: Self-pay | Admitting: *Deleted

## 2015-09-18 NOTE — Progress Notes (Signed)
Spoke with Dr.Klein concerning the safety of exercising this patient with syncope history. Dr.Klein ordered patient to be changed to a Lexiscan.

## 2015-10-01 ENCOUNTER — Emergency Department (HOSPITAL_COMMUNITY)
Admission: EM | Admit: 2015-10-01 | Discharge: 2015-10-01 | Disposition: A | Discharge status: Home / Self Care | Payer: Medicare HMO | Attending: Emergency Medicine | Admitting: Emergency Medicine

## 2015-10-01 ENCOUNTER — Encounter (HOSPITAL_COMMUNITY): Payer: Self-pay | Admitting: *Deleted

## 2015-10-01 ENCOUNTER — Emergency Department (HOSPITAL_COMMUNITY): Payer: Medicare HMO

## 2015-10-01 DIAGNOSIS — Y9389 Activity, other specified: Secondary | ICD-10-CM | POA: Insufficient documentation

## 2015-10-01 DIAGNOSIS — F329 Major depressive disorder, single episode, unspecified: Secondary | ICD-10-CM | POA: Insufficient documentation

## 2015-10-01 DIAGNOSIS — Y998 Other external cause status: Secondary | ICD-10-CM | POA: Diagnosis not present

## 2015-10-01 DIAGNOSIS — F17210 Nicotine dependence, cigarettes, uncomplicated: Secondary | ICD-10-CM | POA: Insufficient documentation

## 2015-10-01 DIAGNOSIS — W108XXA Fall (on) (from) other stairs and steps, initial encounter: Secondary | ICD-10-CM | POA: Diagnosis not present

## 2015-10-01 DIAGNOSIS — S99922A Unspecified injury of left foot, initial encounter: Secondary | ICD-10-CM | POA: Insufficient documentation

## 2015-10-01 DIAGNOSIS — Y9289 Other specified places as the place of occurrence of the external cause: Secondary | ICD-10-CM | POA: Diagnosis not present

## 2015-10-01 DIAGNOSIS — S199XXA Unspecified injury of neck, initial encounter: Secondary | ICD-10-CM | POA: Diagnosis not present

## 2015-10-01 DIAGNOSIS — Z79899 Other long term (current) drug therapy: Secondary | ICD-10-CM | POA: Diagnosis not present

## 2015-10-01 DIAGNOSIS — W19XXXA Unspecified fall, initial encounter: Secondary | ICD-10-CM

## 2015-10-01 DIAGNOSIS — M549 Dorsalgia, unspecified: Secondary | ICD-10-CM

## 2015-10-01 DIAGNOSIS — S3992XA Unspecified injury of lower back, initial encounter: Secondary | ICD-10-CM | POA: Diagnosis not present

## 2015-10-01 LAB — CBC WITH DIFFERENTIAL/PLATELET
BASOS ABS: 0 10*3/uL (ref 0.0–0.1)
BASOS PCT: 0 %
EOS ABS: 0.1 10*3/uL (ref 0.0–0.7)
Eosinophils Relative: 1 %
HCT: 42.4 % (ref 39.0–52.0)
HEMOGLOBIN: 14.3 g/dL (ref 13.0–17.0)
Lymphocytes Relative: 41 %
Lymphs Abs: 3.3 10*3/uL (ref 0.7–4.0)
MCH: 31.6 pg (ref 26.0–34.0)
MCHC: 33.7 g/dL (ref 30.0–36.0)
MCV: 93.6 fL (ref 78.0–100.0)
Monocytes Absolute: 0.4 10*3/uL (ref 0.1–1.0)
Monocytes Relative: 5 %
NEUTROS ABS: 4.1 10*3/uL (ref 1.7–7.7)
NEUTROS PCT: 53 %
Platelets: 217 10*3/uL (ref 150–400)
RBC: 4.53 MIL/uL (ref 4.22–5.81)
RDW: 13.4 % (ref 11.5–15.5)
WBC: 7.9 10*3/uL (ref 4.0–10.5)

## 2015-10-01 LAB — URINALYSIS, ROUTINE W REFLEX MICROSCOPIC
BILIRUBIN URINE: NEGATIVE
Glucose, UA: NEGATIVE mg/dL
HGB URINE DIPSTICK: NEGATIVE
Ketones, ur: NEGATIVE mg/dL
Leukocytes, UA: NEGATIVE
Nitrite: NEGATIVE
PH: 5.5 (ref 5.0–8.0)
Protein, ur: NEGATIVE mg/dL
SPECIFIC GRAVITY, URINE: 1.023 (ref 1.005–1.030)

## 2015-10-01 LAB — BASIC METABOLIC PANEL
ANION GAP: 7 (ref 5–15)
BUN: 12 mg/dL (ref 6–20)
CALCIUM: 8.5 mg/dL — AB (ref 8.9–10.3)
CHLORIDE: 108 mmol/L (ref 101–111)
CO2: 24 mmol/L (ref 22–32)
Creatinine, Ser: 1.25 mg/dL — ABNORMAL HIGH (ref 0.61–1.24)
GFR calc Af Amer: >60 mL/min (ref >60)
GFR calc non Af Amer: >60 mL/min (ref >60)
GLUCOSE: 61 mg/dL — AB (ref 65–99)
Potassium: 4.2 mmol/L (ref 3.5–5.1)
Sodium: 139 mmol/L (ref 135–145)

## 2015-10-01 LAB — I-STAT CHEM 8, ED
BUN: 20 mg/dL (ref 6–20)
CALCIUM ION: 1.15 mmol/L (ref 1.12–1.23)
CHLORIDE: 105 mmol/L (ref 101–111)
CREATININE: 1.4 mg/dL — AB (ref 0.61–1.24)
Glucose, Bld: 58 mg/dL — ABNORMAL LOW (ref 65–99)
HEMATOCRIT: 49 % (ref 39.0–52.0)
Hemoglobin: 16.7 g/dL (ref 13.0–17.0)
Potassium: 5.7 mmol/L — ABNORMAL HIGH (ref 3.5–5.1)
Sodium: 138 mmol/L (ref 135–145)
TCO2: 26 mmol/L (ref 0–100)

## 2015-10-01 MED ORDER — SODIUM CHLORIDE 0.9 % IV BOLUS (SEPSIS)
1000.0000 mL | Freq: Once | INTRAVENOUS | Status: AC
  Administered 2015-10-01: 1000 mL via INTRAVENOUS

## 2015-10-01 MED ORDER — ONDANSETRON HCL 4 MG/2ML IJ SOLN
4.0000 mg | Freq: Once | INTRAMUSCULAR | Status: AC
  Administered 2015-10-01: 4 mg via INTRAVENOUS
  Filled 2015-10-01: qty 2

## 2015-10-01 MED ORDER — CYCLOBENZAPRINE HCL 10 MG PO TABS: 10.0000 mg | ORAL_TABLET | Freq: Two times a day (BID) | ORAL | Status: DC | PRN

## 2015-10-01 MED ORDER — METHOCARBAMOL 500 MG PO TABS
750.0000 mg | ORAL_TABLET | Freq: Once | ORAL | Status: AC
  Administered 2015-10-01: 750 mg via ORAL
  Filled 2015-10-01: qty 2

## 2015-10-01 MED ORDER — HYDROMORPHONE HCL 1 MG/ML IJ SOLN
0.5000 mg | Freq: Once | INTRAMUSCULAR | Status: AC
  Administered 2015-10-01: 0.5 mg via INTRAVENOUS
  Filled 2015-10-01: qty 1

## 2015-10-01 MED ORDER — IOHEXOL 300 MG/ML  SOLN
100.0000 mL | Freq: Once | INTRAMUSCULAR | Status: AC | PRN
  Administered 2015-10-01: 100 mL via INTRAVENOUS

## 2015-10-01 MED ORDER — MORPHINE SULFATE (PF) 4 MG/ML IV SOLN: 4.0000 mg | INTRAVENOUS | Status: DC | PRN

## 2015-10-01 NOTE — ED Provider Notes (Signed)
CSN: [id number]     Arrival date & time 10/01/15  1302 History  By signing my name below, I, Charles Johnson, attest that this documentation has been prepared under the direction and in the presence of [employer], PA-C. Electronically Signed: Lyndel Johnson, ED Scribe. 10/01/2015. 1:59 PM.  Chief Complaint  Patient presents with  . Fall  . Back Pain   The history is provided by the patient. No language interpreter was used.   HPI Comments: Charles Johnson is a 44 y.o. male who presents to the Emergency Department complaining of sudden onset, constant, moderate pain to musculature of lower back, left lateral neck and left foot s/p fall that occurred last night with pain and stiffness worsening this morning. Pt reports he was helping to move a couch down the stairs when he fell backwards down 7-8 steps onto concrete with the couch landing on top of him and hitting him in his chest. Pt notes he was still holding the sofa during the fall. He denies chest pain or bruising to chest. Pt is unsure if he hit his foot during the fall.  Pt notes a sharp radiation of pain from his left foot up the LLE when he applies weight to his left foot. He also notes exacerbation of back and neck pain with movement. He reports taking motrin last night but states he was significantly stiff this morning and had difficulty getting out of bed and dressing himself. Denies LOC or head injury, abdominal pain, chest pain, and overlying skin changes. Pt not taking blood thinning medication.   Past Medical History  Diagnosis Date  . Depression   . Bipolar 1 disorder [facility])    Past Surgical History  Procedure Laterality Date  . Head surgery    . Eye surgery      Traumatic   Family History  Problem Relation Age of Onset  . Hypertension Maternal Uncle   . Heart disease Maternal Grandmother   . Hypertension Maternal Grandmother   . Diabetes Maternal Grandmother   . Heart failure Maternal Grandmother    Social  History  Substance Use Topics  . Smoking status: Current Every Day Smoker -- 0.50 packs/day for 30 years    Types: Cigarettes  . Smokeless tobacco: Never Used  . Alcohol Use: 0.0 oz/week    0 Standard drinks or equivalent per week     Comment: 2 times a week    Review of Systems A complete 10 system review of systems was obtained and is otherwise negative except at noted in the HPI and PMH.  Allergies  Review of patient's allergies indicates no known allergies.  Home Medications   Prior to Admission medications   Medication Sig Start Date End Date Taking? Authorizing Provider  FLUoxetine (PROZAC) 20 MG capsule Take 20 mg by mouth daily. 03/14/15   Historical Provider, MD  traZODone (DESYREL) 50 MG tablet Take 50 mg by mouth at bedtime as needed for sleep.  03/14/15   Historical Provider, MD   BP 125/62 mmHg  Pulse 83  Temp(Src) 98.1 F (36.7 C) (Oral)  Resp 16  Ht 5' 7.5" (1.715 m)  Wt 217 lb 8 oz (98.657 kg)  BMI 33.54 kg/m2  SpO2 98% Physical Exam  Constitutional: He is oriented to person, place, and time. He appears well-developed and well-nourished. No distress.  HENT:  Head: Normocephalic and atraumatic.  Mouth/Throat: Oropharynx is clear and moist.  No abrasions or contusions.   No hemotympanum, battle signs or raccoon's eyes  No crepitance or tenderness to palpation along the orbital rim.  EOMI intact with no pain or diplopia  No abnormal otorrhea or rhinorrhea. Nasal septum midline.  No intraoral trauma.  Eyes: Conjunctivae and EOM are normal. Pupils are equal, round, and reactive to light.  Neck: Normal range of motion. Neck supple.  No midline C-spine  tenderness to palpation or step-offs appreciated. Patient has full range of motion without pain.  Grip/Biceps/Tricep strength 5/5 bilaterally, sensation to UE intact bilaterally.    Cardiovascular: Normal rate, regular rhythm and intact distal pulses.   Pulmonary/Chest: Effort normal and breath sounds  normal. No respiratory distress. He has no wheezes. He has no rales. He exhibits no tenderness.  No seatbelt sign, TTP or crepitance  Abdominal: Soft. Bowel sounds are normal. He exhibits no distension and no mass. There is no tenderness. There is no rebound and no guarding.  No Seatbelt Sign  Musculoskeletal: Normal range of motion. He exhibits no edema or tenderness.  Pelvis stable. No deformity or TTP of major joints.   Good ROM  Neurological: He is alert and oriented to person, place, and time. Coordination normal.  Strength 5/5 x4 extremities   Distal sensation intact  Skin: Skin is warm.  Psychiatric: He has a normal mood and affect. His behavior is normal.  Nursing note and vitals reviewed.   ED Course  Procedures  DIAGNOSTIC STUDIES: Oxygen Saturation is 98% on RA, normal by my interpretation.    COORDINATION OF CARE: 1:54 PM Discussed treatment plan with pt at bedside and pt agreed to plan. Will order trauma series. Consulted with attending Dr. Adela LankFloyd and he is in agreement.   Imaging Review No results found. I have personally reviewed and evaluated these images as part of my medical decision-making.  MDM   Final diagnoses:  Back pain    Filed Vitals:   10/01/15 1330  BP: 125/62  Pulse: 83  Temp: 98.1 F (36.7 C)  TempSrc: Oral  Resp: 16  Height: 5' 7.5" (1.715 m)  Weight: 98.657 kg  SpO2: 98%    Medications  ondansetron (ZOFRAN) injection 4 mg (not administered)  HYDROmorphone (DILAUDID) injection 0.5 mg (not administered)    Charles Johnson is 44 y.o. male presenting with low back pain status post fall down flight of steps onto concrete, a couch may have impacted his chest and abdomen. There is no bruising, no peritoneal signs and patient is really distracted by the low back pain. He has normal bowel sounds but out of a abundance of caution I think it is reasonable to obtain CT head neck chest pelvis given the mechanism of injury. Concern that his back  pain may be a retroperitoneal bleed. He is not anticoagulated. Vital signs with no abnormality.  CT with no acute abnormalities, i-STAT Chem-8 shows a creatinine of 1.4 and a potassium of 5.7. Case signed out to PA Dowless at shift change: Plan is to follow-up EKG, recheck potassium on BMET. Patient will be given bolus and placed on cardiac monitoring. Will treat hyperkalemia as needed.  I personally performed the services described in this documentation, which was scribed in my presence. The recorded information has been reviewed and is accurate.    Wynetta Emeryicole Senta Kantor, PA-C 10/01/15 1727  Wynetta Emeryicole Renesmee Raine, PA-C 10/03/15 1213  Daimion Adamcik, PA-C 10/03/15 1214  Melene Planan Floyd, DO 10/03/15 1346

## 2015-10-01 NOTE — ED Notes (Signed)
Pt reports falling down several steps last night while moving furniture. Denies loc. Pt having neck pain, lower back pain that radiates down left leg.

## 2015-10-01 NOTE — Discharge Instructions (Signed)
Back Pain, Adult °Back pain is very common in adults. The cause of back pain is rarely dangerous and the pain often gets better over time. The cause of your back pain may not be known. Some common causes of back pain include: °· Strain of the muscles or ligaments supporting the spine. °· Wear and tear (degeneration) of the spinal disks. °· Arthritis. °· Direct injury to the back. °For many people, back pain may return. Since back pain is rarely dangerous, most people can learn to manage this condition on their own. °HOME CARE INSTRUCTIONS °Watch your back pain for any changes. The following actions may help to lessen any discomfort you are feeling: °· Remain active. It is stressful on your back to sit or stand in one place for long periods of time. Do not sit, drive, or stand in one place for more than 30 minutes at a time. Take short walks on even surfaces as soon as you are able. Try to increase the length of time you walk each day. °· Exercise regularly as directed by your health care provider. Exercise helps your back heal faster. It also helps avoid future injury by keeping your muscles strong and flexible. °· Do not stay in bed. Resting more than 1-2 days can delay your recovery. °· Pay attention to your body when you bend and lift. The most comfortable positions are those that put less stress on your recovering back. Always use proper lifting techniques, including: °¨ Bending your knees. °¨ Keeping the load close to your body. °¨ Avoiding twisting. °· Find a comfortable position to sleep. Use a firm mattress and lie on your side with your knees slightly bent. If you lie on your back, put a pillow under your knees. °· Avoid feeling anxious or stressed. Stress increases muscle tension and can worsen back pain. It is important to recognize when you are anxious or stressed and learn ways to manage it, such as with exercise. °· Take medicines only as directed by your health care provider. Over-the-counter  medicines to reduce pain and inflammation are often the most helpful. Your health care provider may prescribe muscle relaxant drugs. These medicines help dull your pain so you can more quickly return to your normal activities and healthy exercise. °· Apply ice to the injured area: °¨ Put ice in a plastic bag. °¨ Place a towel between your skin and the bag. °¨ Leave the ice on for 20 minutes, 2-3 times a day for the first 2-3 days. After that, ice and heat may be alternated to reduce pain and spasms. °· Maintain a healthy weight. Excess weight puts extra stress on your back and makes it difficult to maintain good posture. °SEEK MEDICAL CARE IF: °· You have pain that is not relieved with rest or medicine. °· You have increasing pain going down into the legs or buttocks. °· You have pain that does not improve in one week. °· You have night pain. °· You lose weight. °· You have a fever or chills. °SEEK IMMEDIATE MEDICAL CARE IF:  °· You develop new bowel or bladder control problems. °· You have unusual weakness or numbness in your arms or legs. °· You develop nausea or vomiting. °· You develop abdominal pain. °· You feel faint. °  °This information is not intended to replace advice given to you by your health care provider. Make sure you discuss any questions you have with your health care provider. °  °Document Released: 10/27/2005 Document Revised: 11/17/2014 Document Reviewed: 02/28/2014 °Elsevier Interactive Patient Education ©2016 [employer] ° °Back Injury Prevention °Back injuries can be very painful. They can also be difficult to heal. After having one back injury, you are more likely to injure your back again. It is important to learn how to avoid injuring or re-injuring your back. The following tips can help you to prevent a back injury. °WHAT SHOULD I KNOW ABOUT PHYSICAL FITNESS? °· Exercise for 30 minutes per day on most days of the week or as told by your doctor. Make sure to: °¨ Do aerobic exercises,  such as walking, jogging, biking, or swimming. °¨ Do exercises that increase balance and strength, such as tai chi and yoga. °¨ Do stretching exercises. This helps with flexibility. °¨ Try to develop strong belly (abdominal) muscles. Your belly muscles help to support your back. °· Stay at a healthy weight. This helps to decrease your risk of a back injury. °WHAT SHOULD I KNOW ABOUT MY DIET? °· Talk with your doctor about your overall diet. Take supplements and vitamins only as told by your doctor. °· Talk with your doctor about how much calcium and vitamin D you need each day. These nutrients help to prevent weakening of the bones (osteoporosis). °· Include good sources of calcium in your diet, such as: °¨ Dairy products. °¨ Green leafy vegetables. °¨ Products that have had calcium added to them (fortified). °· Include good sources of vitamin D in your diet, such as: °¨ Milk. °¨ Foods that have had vitamin D added to them. °WHAT SHOULD I KNOW ABOUT MY POSTURE? °· Sit up straight and stand up straight. Avoid leaning forward when you sit or hunching over when you stand. °· Choose chairs that have good low-back (lumbar) support. °· If you work at a desk, sit close to it so you do not need to lean over. Keep your chin tucked in. Keep your neck drawn back. Keep your elbows bent so your arms look like the letter "L" (right angle). °· Sit high and close to the steering wheel when you drive. Add a low-back support to your car seat, if needed. °· Avoid sitting or standing in one position for very long. Take breaks to get up, stretch, and walk around at least one time every hour. Take breaks every hour if you are driving for long periods of time. °· Sleep on your side with your knees slightly bent, or sleep on your back with a pillow under your knees. Do not lie on the front of your body to sleep. °WHAT SHOULD I KNOW ABOUT LIFTING, TWISTING, AND REACHING °Lifting and Heavy Lifting  °· Avoid heavy lifting, especially lifting  over and over again. If you must do heavy lifting: °¨ Stretch before lifting. °¨ Work slowly. °¨ Rest between lifts. °¨ Use a tool such as a cart or a dolly to move objects if one is available. °¨ Make several small trips instead of carrying one heavy load. °¨ Ask for help when you need it, especially when moving big objects. °· Follow these steps when lifting: °¨ Stand with your feet shoulder-width apart. °¨ Get as close to the object as you can. Do not pick up a heavy object that is far from your body. °¨ Use handles or lifting straps if they are available. °¨ Bend at your knees. Squat down, but keep your heels off the floor. °¨ Keep your shoulders back. Keep your chin tucked in. Keep your back straight. °¨ Lift the object slowly while you tighten the muscles in your legs, belly, and butt.   Keep the object as close to the center of your body as possible.  Follow these steps when putting down a heavy load:  Stand with your feet shoulder-width apart.  Lower the object slowly while you tighten the muscles in your legs, belly, and butt. Keep the object as close to the center of your body as possible.  Keep your shoulders back. Keep your chin tucked in. Keep your back straight.  Bend at your knees. Squat down, but keep your heels off the floor.  Use handles or lifting straps if they are available. Twisting and Reaching  Avoid lifting heavy objects above your waist.  Do not twist at your waist while you are lifting or carrying a load. If you need to turn, move your feet.  Do not bend over without bending at your knees.  Avoid reaching over your head, across a table, or for an object on a high surface.  WHAT ARE SOME OTHER TIPS?  Avoid wet floors and icy ground. Keep sidewalks clear of ice to prevent falls.   Do not sleep on a mattress that is too soft or too hard.   Keep items that you use often within easy reach.   Put heavier objects on shelves at waist level, and put lighter objects  on lower or higher shelves.  Find ways to lower your stress, such as:  Exercise.  Massage.  Relaxation techniques.  Talk with your doctor if you feel anxious or depressed. These conditions can make back pain worse.  Wear flat heel shoes with cushioned soles.  Avoid making quick (sudden) movements.  Use both shoulder straps when carrying a backpack.  Do not use any tobacco products, including cigarettes, chewing tobacco, or electronic cigarettes. If you need help quitting, ask your doctor.   This information is not intended to replace advice given to you by your health care provider. Make sure you discuss any questions you have with your health care provider.   Follow up with your PCP for re-evaluation. Take ibuprofen as needed for pain. Return to the ED if you experience worsening of your symptoms, bowel or bladder incontinence, numbness/tingling in your legs, recurrent fall, chest pain or shortness of breath.

## 2015-10-02 ENCOUNTER — Encounter (HOSPITAL_COMMUNITY): Payer: Medicare HMO

## 2015-10-16 ENCOUNTER — Ambulatory Visit (HOSPITAL_COMMUNITY): Payer: Medicare HMO

## 2015-10-22 ENCOUNTER — Telehealth (HOSPITAL_COMMUNITY): Payer: Self-pay | Admitting: *Deleted

## 2015-10-22 NOTE — Telephone Encounter (Signed)
Patient given detailed instructions per Myocardial Perfusion Study Information Sheet for the test on 10/23/15 at 730. Patient notified to arrive 15 minutes early and that it is imperative to arrive on time for appointment to keep from having the test rescheduled.  If you need to cancel or reschedule your appointment, please call the office within 24 hours of your appointment. Failure to do so may result in a cancellation of your appointment, and a $50 no show fee. Patient verbalized understanding.Charles CharMary J Harlie Buening, RN

## 2015-10-23 ENCOUNTER — Encounter (HOSPITAL_COMMUNITY): Payer: Medicare HMO

## 2015-10-24 ENCOUNTER — Encounter: Payer: Self-pay | Admitting: *Deleted

## 2015-10-24 ENCOUNTER — Ambulatory Visit (HOSPITAL_COMMUNITY): Payer: Medicare HMO | Attending: Cardiovascular Disease

## 2015-10-24 DIAGNOSIS — R079 Chest pain, unspecified: Secondary | ICD-10-CM | POA: Insufficient documentation

## 2015-10-24 DIAGNOSIS — R55 Syncope and collapse: Secondary | ICD-10-CM | POA: Insufficient documentation

## 2015-10-24 DIAGNOSIS — R0600 Dyspnea, unspecified: Secondary | ICD-10-CM | POA: Insufficient documentation

## 2015-10-24 LAB — MYOCARDIAL PERFUSION IMAGING
CHL CUP NUCLEAR SRS: 0
CHL CUP NUCLEAR SSS: 0
LV dias vol: 126 mL
LV sys vol: 71 mL
Peak HR: 90 {beats}/min
RATE: 0.26
Rest HR: 62 {beats}/min
SDS: 0
TID: 1.05

## 2015-10-24 MED ORDER — TECHNETIUM TC 99M SESTAMIBI GENERIC - CARDIOLITE
10.5000 | Freq: Once | INTRAVENOUS | Status: AC | PRN
  Administered 2015-10-24: 11 via INTRAVENOUS

## 2015-10-24 MED ORDER — REGADENOSON 0.4 MG/5ML IV SOLN
0.4000 mg | Freq: Once | INTRAVENOUS | Status: AC
  Administered 2015-10-24: 0.4 mg via INTRAVENOUS

## 2015-10-24 MED ORDER — TECHNETIUM TC 99M SESTAMIBI GENERIC - CARDIOLITE
33.0000 | Freq: Once | INTRAVENOUS | Status: AC | PRN
  Administered 2015-10-24: 33 via INTRAVENOUS

## 2015-10-29 ENCOUNTER — Telehealth: Payer: Self-pay | Admitting: Internal Medicine

## 2015-10-29 NOTE — Telephone Encounter (Signed)
Please Inform Patient that stress test showed no evidence of ischemia, stab heart muscle function whichismildly weakened Thanks   PT'S MOM AWARE OF  MYOVIEW  RESULTS .Zack Seal/CY

## 2015-10-29 NOTE — Telephone Encounter (Signed)
New Message   Pt mother called and said she is returning call for pt about results

## 2015-12-06 ENCOUNTER — Encounter (HOSPITAL_COMMUNITY): Payer: Self-pay | Admitting: Emergency Medicine

## 2015-12-06 ENCOUNTER — Emergency Department (HOSPITAL_COMMUNITY): Payer: Medicare HMO

## 2015-12-06 ENCOUNTER — Emergency Department (HOSPITAL_COMMUNITY)
Admission: EM | Admit: 2015-12-06 | Discharge: 2015-12-06 | Disposition: A | Discharge status: Home / Self Care | Payer: Medicare HMO | Attending: Emergency Medicine | Admitting: Emergency Medicine

## 2015-12-06 DIAGNOSIS — F17210 Nicotine dependence, cigarettes, uncomplicated: Secondary | ICD-10-CM | POA: Insufficient documentation

## 2015-12-06 DIAGNOSIS — R55 Syncope and collapse: Secondary | ICD-10-CM | POA: Insufficient documentation

## 2015-12-06 DIAGNOSIS — Z8659 Personal history of other mental and behavioral disorders: Secondary | ICD-10-CM | POA: Diagnosis not present

## 2015-12-06 DIAGNOSIS — R42 Dizziness and giddiness: Secondary | ICD-10-CM | POA: Insufficient documentation

## 2015-12-06 LAB — COMPREHENSIVE METABOLIC PANEL
ALBUMIN: 3.1 g/dL — AB (ref 3.5–5.0)
ALT: 17 U/L (ref 17–63)
AST: 24 U/L (ref 15–41)
Alkaline Phosphatase: 51 U/L (ref 38–126)
Anion gap: 6 (ref 5–15)
BILIRUBIN TOTAL: 0.2 mg/dL — AB (ref 0.3–1.2)
BUN: 15 mg/dL (ref 6–20)
CHLORIDE: 110 mmol/L (ref 101–111)
CO2: 23 mmol/L (ref 22–32)
Calcium: 8.1 mg/dL — ABNORMAL LOW (ref 8.9–10.3)
Creatinine, Ser: 1.32 mg/dL — ABNORMAL HIGH (ref 0.61–1.24)
GFR calc Af Amer: >60 mL/min (ref >60)
GFR calc non Af Amer: >60 mL/min (ref >60)
GLUCOSE: 124 mg/dL — AB (ref 65–99)
POTASSIUM: 3.5 mmol/L (ref 3.5–5.1)
SODIUM: 139 mmol/L (ref 135–145)
Total Protein: 5.5 g/dL — ABNORMAL LOW (ref 6.5–8.1)

## 2015-12-06 LAB — CBC
HCT: 39.8 % (ref 39.0–52.0)
HEMOGLOBIN: 13.8 g/dL (ref 13.0–17.0)
MCH: 32 pg (ref 26.0–34.0)
MCHC: 34.7 g/dL (ref 30.0–36.0)
MCV: 92.3 fL (ref 78.0–100.0)
Platelets: 204 10*3/uL (ref 150–400)
RBC: 4.31 MIL/uL (ref 4.22–5.81)
RDW: 13 % (ref 11.5–15.5)
WBC: 5.8 10*3/uL (ref 4.0–10.5)

## 2015-12-06 LAB — I-STAT TROPONIN, ED: TROPONIN I, POC: 0.01 ng/mL (ref 0.00–0.08)

## 2015-12-06 LAB — CBG MONITORING, ED: Glucose-Capillary: 135 mg/dL — ABNORMAL HIGH (ref 65–99)

## 2015-12-06 NOTE — ED Notes (Signed)
Per pt, states he woke up this am and passed out-states this has happened in the past-states cough

## 2015-12-06 NOTE — ED Provider Notes (Signed)
CSN: [id number]     Arrival date & time 12/06/15  1152 History   First MD Initiated Contact with Patient 12/06/15 1331     Chief Complaint  Patient presents with  . Chest Pain   HPI   45 year old male presents today with syncope. Patient reports that over the last 5 months he's had several syncopal episodes per month. He's been seen in the emergency room, primary care provider's office, and by cardiology with no significant findings on his evaluations. She has had MRI of the brain, cardiac stress test, Holter monitor, echo repeat chest x-ray, EKGs, and laboratory analysis. Patient reports that this happens episodically, after waking up first thing in the morning he has a syncopal episode. He reports he has passed out at other times, but mostly after waking. He reports sitting up causes dizziness with syncope. He reports that after the initial episode he feels better throughout the day and rarely has any signs of dizziness. He reports that today he woke up passed out, undetermined amount of time. He states that he has not eaten anything since yesterday, does not drink water. Patient denies any presyncopal symptoms including chest pain, shortness of breath, headache. No noted seizure-like activity, with the exception of one episode noted by his mother. No postictal phase. At the time of my evaluation patient denies any complaints. Other than some upper respiratory symptoms including rhinorrhea, dry nonproductive cough. Patient reports today's episode is identical to previous with no changes. Patient denies any drug or alcohol use.   Past Medical History  Diagnosis Date  . Depression   . Bipolar 1 disorder [facility])    Past Surgical History  Procedure Laterality Date  . Head surgery    . Eye surgery      Traumatic   Family History  Problem Relation Age of Onset  . Hypertension Maternal Uncle   . Heart disease Maternal Grandmother   . Hypertension Maternal Grandmother   . Diabetes Maternal  Grandmother   . Heart failure Maternal Grandmother    Social History  Substance Use Topics  . Smoking status: Current Every Day Smoker -- 0.50 packs/day for 30 years    Types: Cigarettes  . Smokeless tobacco: Never Used  . Alcohol Use: 0.0 oz/week    0 Standard drinks or equivalent per week     Comment: 2 times a week    Review of Systems  All other systems reviewed and are negative.   Allergies  Review of patient's allergies indicates no known allergies.  Home Medications   Prior to Admission medications   Medication Sig Start Date End Date Taking? Authorizing Provider  cyclobenzaprine (FLEXERIL) 10 MG tablet Take 1 tablet (10 mg total) by mouth 2 (two) times daily as needed for muscle spasms. 10/01/15   Samantha Tripp Dowless, PA-C   BP 106/90 mmHg  Pulse 75  Temp(Src) 98 F (36.7 C) (Oral)  Resp 17  SpO2 99%   Physical Exam  Constitutional: He is oriented to person, place, and time. He appears well-developed and well-nourished.  HENT:  Head: Normocephalic and atraumatic.  Eyes: Conjunctivae are normal. Pupils are equal, round, and reactive to light. Right eye exhibits no discharge. Left eye exhibits no discharge. No scleral icterus.  Neck: Normal range of motion. No JVD present. No tracheal deviation present.  Cardiovascular: Normal rate, regular rhythm and intact distal pulses.  Exam reveals no gallop and no friction rub.   No murmur heard. Pulmonary/Chest: Effort normal and breath sounds normal. No stridor.  No respiratory distress. He has no wheezes. He has no rales. He exhibits no tenderness.  Abdominal: Soft. He exhibits no distension and no mass. There is no tenderness. There is no rebound and no guarding.  Musculoskeletal: Normal range of motion. He exhibits no edema or tenderness.  Neurological: He is alert and oriented to person, place, and time. Coordination normal.  Skin: Skin is warm and dry. No rash noted. No erythema. No pallor.  Psychiatric: He has a  normal mood and affect. His behavior is normal. Judgment and thought content normal.  Nursing note and vitals reviewed.     ED Course  Procedures (including critical care time) Labs Review Labs Reviewed  COMPREHENSIVE METABOLIC PANEL - Abnormal; Notable for the following:    Glucose, Bld 124 (*)    Creatinine, Ser 1.32 (*)    Calcium 8.1 (*)    Total Protein 5.5 (*)    Albumin 3.1 (*)    Total Bilirubin 0.2 (*)    All other components within normal limits  CBG MONITORING, ED - Abnormal; Notable for the following:    Glucose-Capillary 135 (*)    All other components within normal limits  CBC  I-STAT TROPOININ, ED    Imaging Review Dg Chest 2 View  12/06/2015  CLINICAL DATA:  45 year old with 1 week history of productive cough, chest pain, shortness of breath, fever, dizziness and night sweats. Syncopal episode earlier today. Current smoker. EXAM: CHEST  2 VIEW COMPARISON:  03/02/2009. FINDINGS: AP semi-erect and lateral images were obtained. Cardiomediastinal silhouette unremarkable, unchanged. Mild central peribronchial thickening, more so than on the prior examination. Lungs otherwise clear. No localized airspace consolidation. No pleural effusions. No pneumothorax. Normal pulmonary vascularity. Minimal degenerative disc disease and spondylosis involving the mid thoracic spine. IMPRESSION: Mild changes of acute bronchitis and/or asthma without focal airspace pneumonia. Electronically Signed   By: Hulan Saas M.D.   On: 12/06/2015 12:33   I have personally reviewed and evaluated these images and lab results as part of my medical decision-making.   EKG Interpretation None      MDM   Final diagnoses:  Syncope, unspecified syncope type    Labs:  Imaging:  Consults:  Therapeutics:  Discharge Meds:   Assessment/Plan: Patient presents after syncopal episode, no significant findings on above diagnostic or laboratory data. This appears similar to previous episodes with  no significant findings in the past. Patient is asymptomatic at this time, no chest pain, shortness of breath, headache, or any other concerning signs or symptoms. Patient is instructed to maintain adequate hydration, follow-up with his primary care for reevaluation and further management of his ongoing chronic syncopal episodes. Patient verbalizes understanding and agreement for today's plan and had no further questions or concerns at time of discharge.          Eyvonne Mechanic, PA-C 12/06/15 1655  Mancel Bale, MD 12/07/15 [phone]

## 2015-12-06 NOTE — ED Notes (Signed)
Patient left prior to discharge and prior to staff obtaining discharge vital signs or discharge signature.

## 2015-12-06 NOTE — Discharge Instructions (Signed)

## 2015-12-13 ENCOUNTER — Ambulatory Visit (INDEPENDENT_AMBULATORY_CARE_PROVIDER_SITE_OTHER): Payer: Medicare HMO | Admitting: Family

## 2015-12-13 ENCOUNTER — Encounter: Payer: Self-pay | Admitting: Family

## 2015-12-13 ENCOUNTER — Other Ambulatory Visit (INDEPENDENT_AMBULATORY_CARE_PROVIDER_SITE_OTHER): Payer: Medicare HMO

## 2015-12-13 VITALS — BP 100/70 | HR 75 | Temp 98.4°F | Resp 18 | Ht 67.5 in | Wt 213.0 lb

## 2015-12-13 DIAGNOSIS — R55 Syncope and collapse: Secondary | ICD-10-CM | POA: Diagnosis not present

## 2015-12-13 DIAGNOSIS — R61 Generalized hyperhidrosis: Secondary | ICD-10-CM

## 2015-12-13 LAB — TSH: TSH: 1.22 u[IU]/mL (ref 0.35–4.50)

## 2015-12-13 NOTE — Progress Notes (Signed)
   Subjective:    Patient ID: Charles Johnson, male    DOB: 1970/12/27, 45 y.o.   MRN: [id number]  Chief Complaint  Patient presents with  . Hospitalization Follow-up    was seen for passing out, states he wakes up everyday with cold sweats and his bed soak and wet    HPI:  Charles Johnson is a 45 y.o. male who  has a past medical history of Depression and Bipolar 1 disorder (HCC). and presents today for a follow up office visit.    Continues to experience syncopal episodes and was recently evaluated in the ED with no significant findings. Also notes that he wakes up with cold sweats in the morning which has been going on for several months. Describes general malaise and sleeps about 5 hours per night which he feels is too much. Syncope only occurs every once in a while. Denies any modifying factors that make it better or worse. Has recently been evaluated by cardiology with concern for neurally mediated syncope. All other testing including MRI and Lexiscan have been negative to this point.   No Known Allergies   Current Outpatient Prescriptions on File Prior to Visit  Medication Sig Dispense Refill  . cyclobenzaprine (FLEXERIL) 10 MG tablet Take 1 tablet (10 mg total) by mouth 2 (two) times daily as needed for muscle spasms. 20 tablet 0   No current facility-administered medications on file prior to visit.    Review of Systems  Constitutional: Positive for chills and diaphoresis. Negative for fever and unexpected weight change.  Respiratory: Positive for chest tightness (occasional) and shortness of breath (occasional).   Neurological: Positive for dizziness, syncope and light-headedness. Negative for numbness.      Objective:    BP 100/70 mmHg  Pulse 75  Temp(Src) 98.4 F (36.9 C) (Oral)  Resp 18  Ht 5' 7.5" (1.715 m)  Wt 213 lb (96.616 kg)  BMI 32.85 kg/m2  SpO2 97% Nursing note and vital signs reviewed.  Physical Exam  Constitutional: He is oriented to person, place,  and time. He appears well-developed and well-nourished. He appears lethargic.  Non-toxic appearance. He does not have a sickly appearance. He does not appear ill. No distress.  Cardiovascular: Normal rate, regular rhythm, normal heart sounds and intact distal pulses.   Pulmonary/Chest: Effort normal and breath sounds normal.  Neurological: He is oriented to person, place, and time. He appears lethargic.  Skin: Skin is warm and dry.  Psychiatric: He has a normal mood and affect. His behavior is normal. Judgment and thought content normal.       Assessment & Plan:   Problem List Items Addressed This Visit      Cardiovascular and Mediastinum   Syncope - Primary (Chronic)    Continues to experience syncopal episodes most recently evaluated in the emergency room with no significant findings. Workup to this point has been negative with some concern for neurally mediated syncope. Obtain TSH to rule out thyroid dysfunction. Recommend follow-up with cardiology for further evaluation. Question possible underlying substance abuse. Follow-up if symptoms worsen or fail to improve prior to follow-up with cardiology.      Relevant Orders   TSH

## 2015-12-13 NOTE — Assessment & Plan Note (Signed)
Continues to experience syncopal episodes most recently evaluated in the emergency room with no significant findings. Workup to this point has been negative with some concern for neurally mediated syncope. Obtain TSH to rule out thyroid dysfunction. Recommend follow-up with cardiology for further evaluation. Question possible underlying substance abuse. Follow-up if symptoms worsen or fail to improve prior to follow-up with cardiology.

## 2015-12-13 NOTE — Patient Instructions (Signed)
Thank you for choosing [facility].  Summary/Instructions:  Please stop by the lab on the basement level of the building for your blood work. Your results will be released to MyChart (or called to you) after review, usually within 72 hours after test completion. If any changes need to be made, you will be notified at that same time.  If your symptoms worsen or fail to improve, please contact our office for further instruction, or in case of emergency go directly to the emergency room at the closest medical facility.   Please follow up with Dr. Odessa Fleming office for further evaluation of the potential for neurally mediated syncope.  Continue to drink plenty of fluids.  Follow up after Dr. Dorene Grebe visit.

## 2015-12-13 NOTE — Progress Notes (Signed)
Pre visit review using our clinic review tool, if applicable. No additional management support is needed unless otherwise documented below in the visit note. 

## 2016-02-07 ENCOUNTER — Encounter (INDEPENDENT_AMBULATORY_CARE_PROVIDER_SITE_OTHER): Payer: Self-pay

## 2016-03-05 IMAGING — CT CT L SPINE W/O CM
3 series · 16 of 33 positions shown, 19 images · non-contrast
Comparison: None.

CLINICAL DATA: Fall last night

EXAM:
CT THORACIC AND LUMBAR SPINE WITHOUT CONTRAST
TECHNIQUE: Multidetector CT imaging of the thoracic and lumbar spine was
performed without contrast. Multiplanar CT image reconstructions
were also generated.

[Series 12: lspine st · axial · 0.29mm/px · z∈[+871,+1061]mm · 8 of 113 slices shown, 10 images]
[im 9/113  soft-tissue]
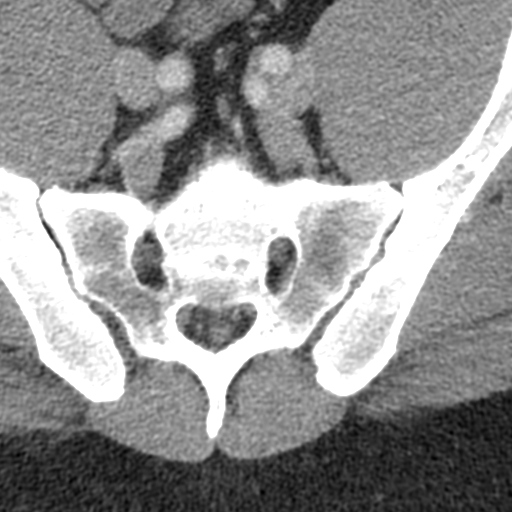
[im 9/113  bone]
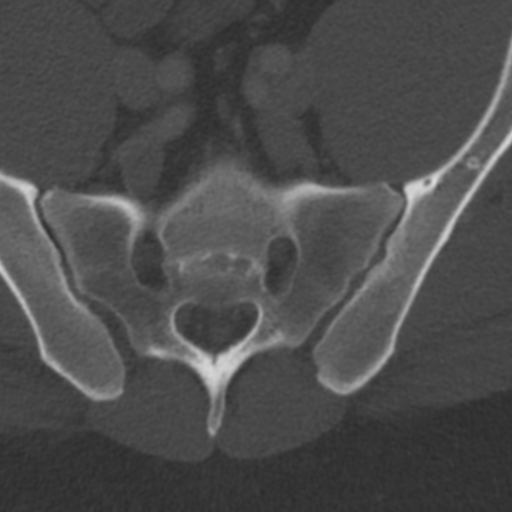
[im 26/113  bone]
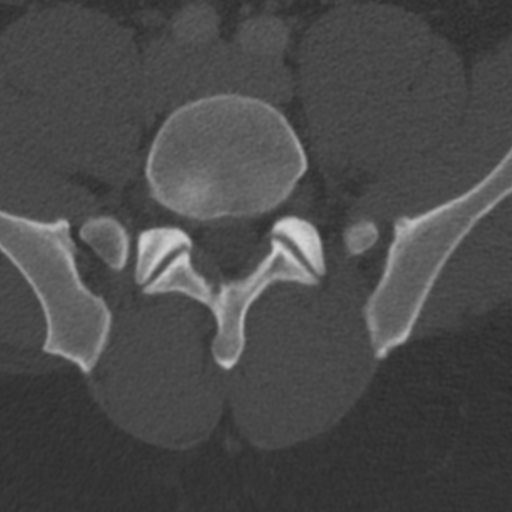
[im 35/113  bone]
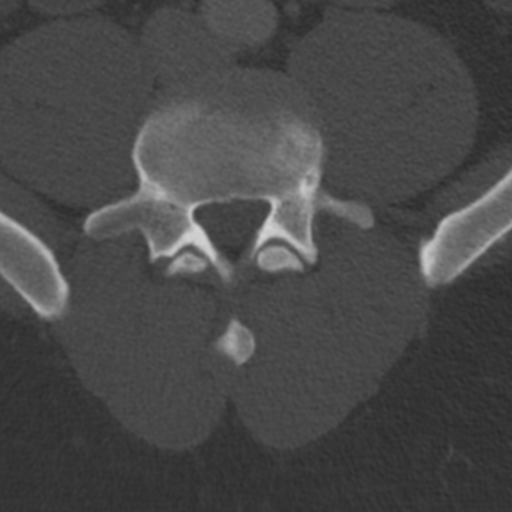
[im 52/113  bone]
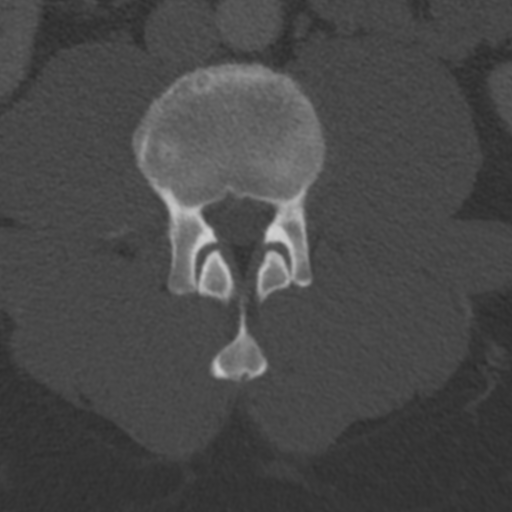
[im 61/113  soft-tissue]
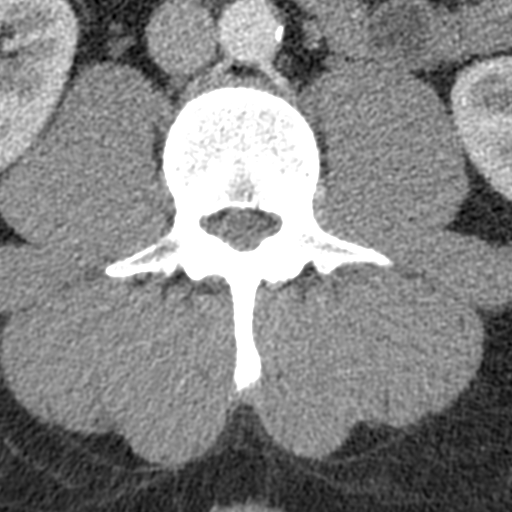
[im 61/113  bone]
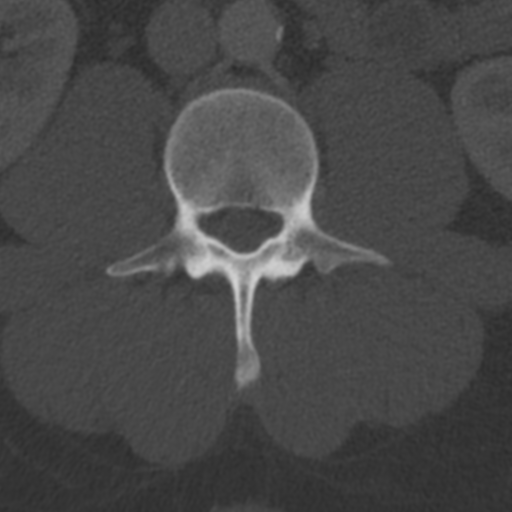
[im 78/113  bone]
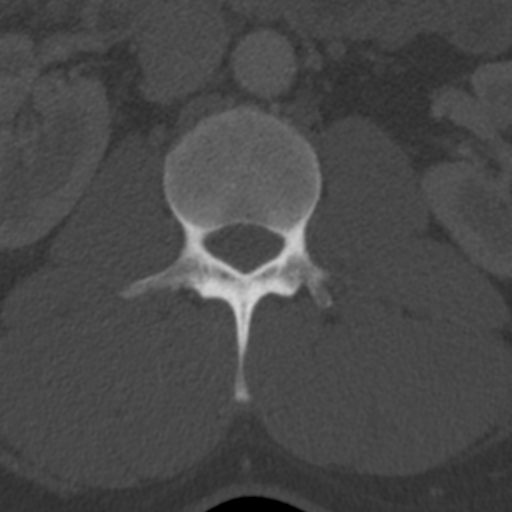
[im 87/113  bone]
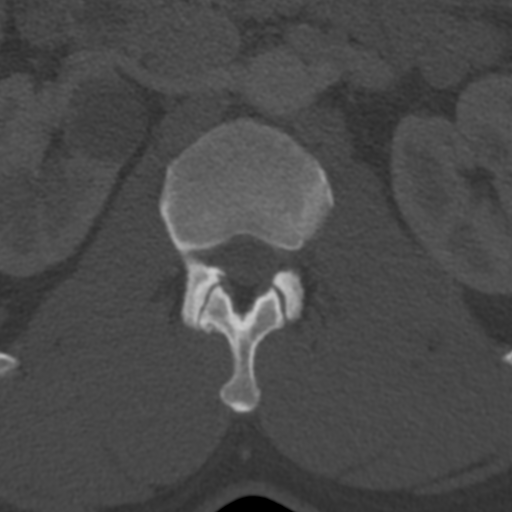
[im 104/113  bone]
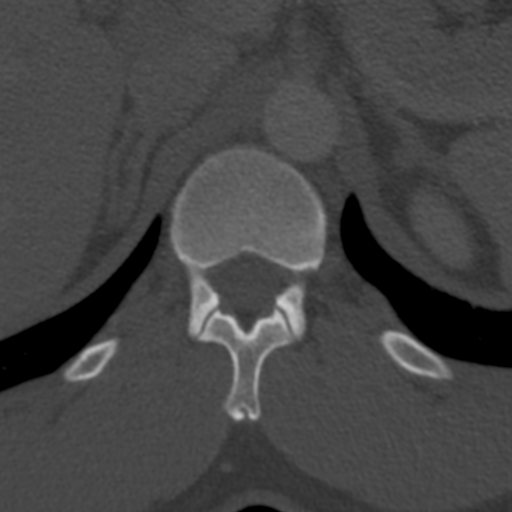

[Series 605: lspine cor · coronal · 0.44mm/px · 3 of 49 slices shown]
[im 10/49  bone]
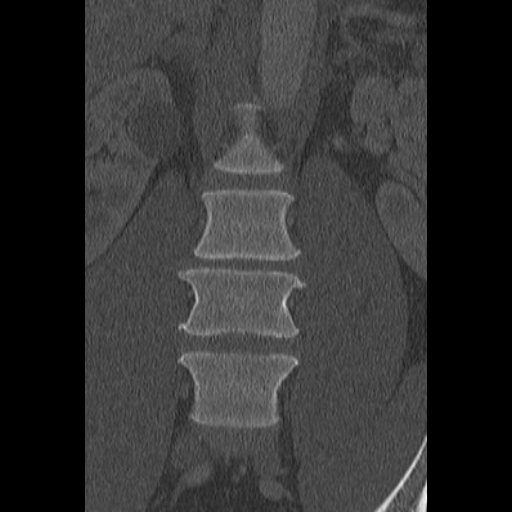
[im 20/49  bone]
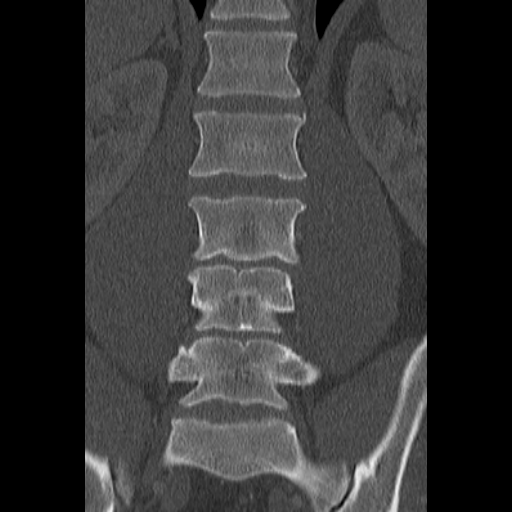
[im 29/49  bone]
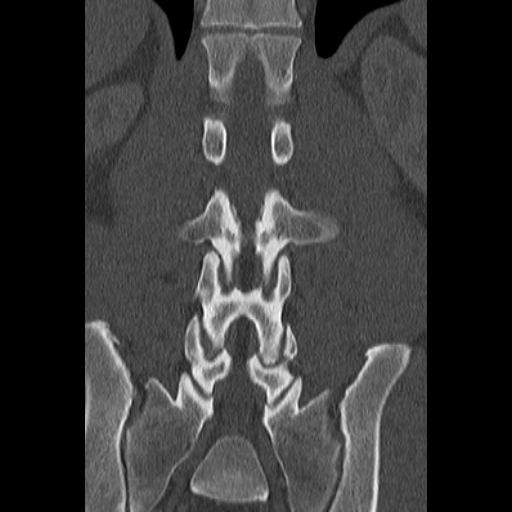

[Series 606: lspine sag · sagittal · 0.44mm/px · 5 of 50 slices shown, 6 images]
[im 17/50  bone]
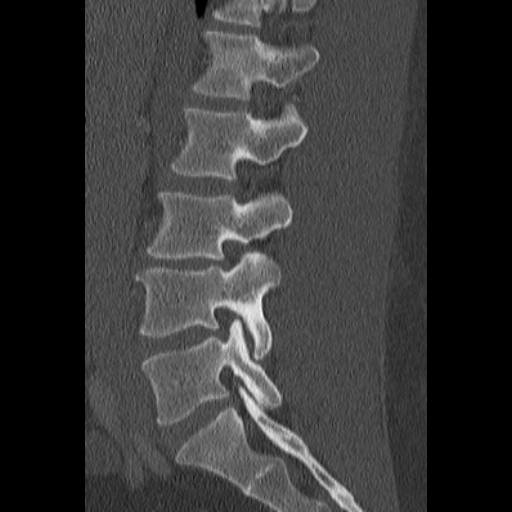
[im 21/50  bone]
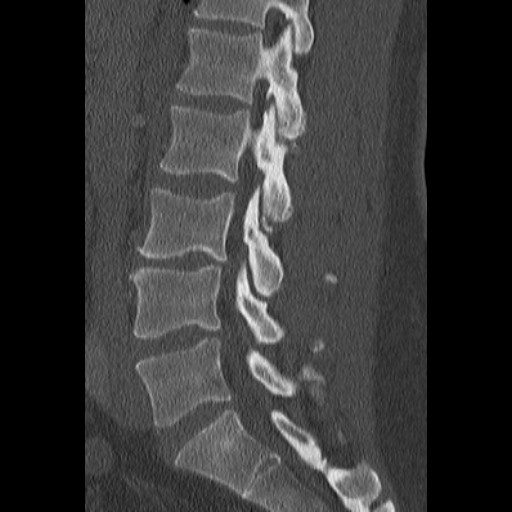
[im 25/50  soft-tissue]
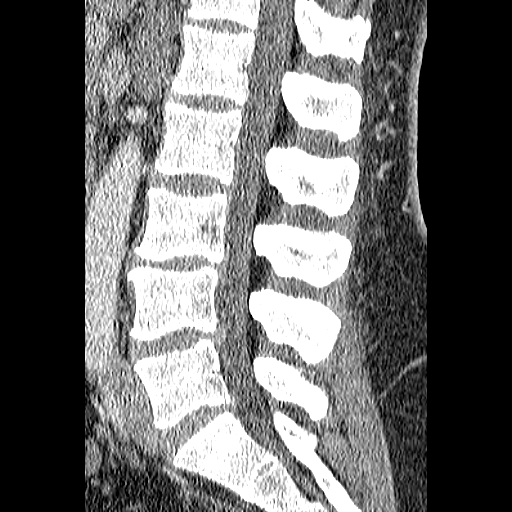
[im 25/50  bone]
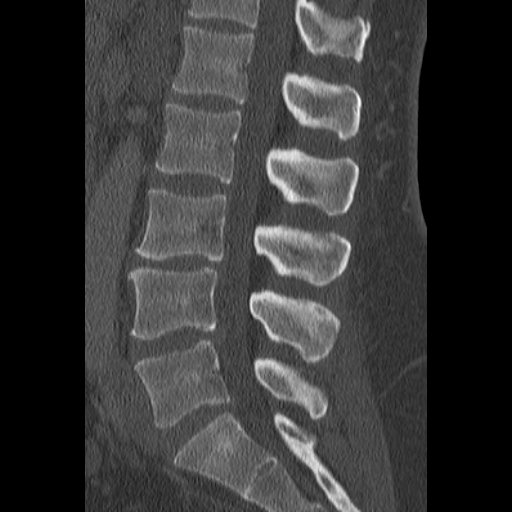
[im 29/50  bone]
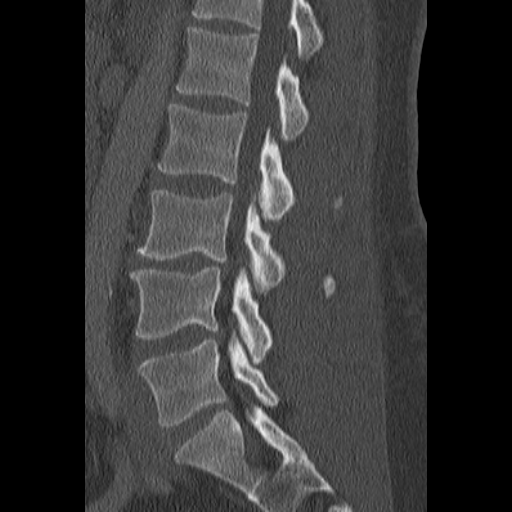
[im 33/50  bone]
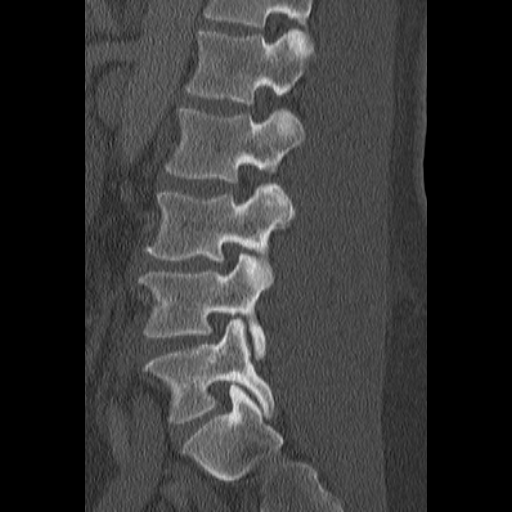

[16 of 33 positions shown; findings below may reference images not displayed]

FINDINGS: CT THORACIC SPINE FINDINGS

There is no fracture or vertebral compression deformity. Scattered
osteophytes throughout the thoracic spine are noted. No obvious
spinal hematoma.

CT LUMBAR SPINE FINDINGS

There is no fracture or dislocation. There is anatomic alignment.
There is no obvious spinal hematoma. Shallow right paracentral disc
protrusion occurs at L4-5. There is moderate disc space narrowing at
L3-4 with mild concentric disc bulge.
IMPRESSION: CT THORACIC SPINE IMPRESSION

No acute bony injury.  Mild degenerative changes.

CT LUMBAR SPINE IMPRESSION

No acute bony injury in the lumbar spine. Degenerative disc disease
occurs at L3-4 and L4-5 as described above.

## 2016-05-03 ENCOUNTER — Emergency Department (HOSPITAL_COMMUNITY)
Admission: EM | Admit: 2016-05-03 | Discharge: 2016-05-03 | Disposition: A | Discharge status: Home / Self Care | Payer: Medicare HMO | Attending: Emergency Medicine | Admitting: Emergency Medicine

## 2016-05-03 ENCOUNTER — Encounter (HOSPITAL_COMMUNITY): Payer: Self-pay

## 2016-05-03 DIAGNOSIS — Y929 Unspecified place or not applicable: Secondary | ICD-10-CM | POA: Insufficient documentation

## 2016-05-03 DIAGNOSIS — Y999 Unspecified external cause status: Secondary | ICD-10-CM | POA: Insufficient documentation

## 2016-05-03 DIAGNOSIS — S81811A Laceration without foreign body, right lower leg, initial encounter: Secondary | ICD-10-CM | POA: Insufficient documentation

## 2016-05-03 DIAGNOSIS — W268XXA Contact with other sharp object(s), not elsewhere classified, initial encounter: Secondary | ICD-10-CM | POA: Diagnosis not present

## 2016-05-03 DIAGNOSIS — Y939 Activity, unspecified: Secondary | ICD-10-CM | POA: Insufficient documentation

## 2016-05-03 DIAGNOSIS — F17210 Nicotine dependence, cigarettes, uncomplicated: Secondary | ICD-10-CM | POA: Insufficient documentation

## 2016-05-03 MED ORDER — LIDOCAINE-EPINEPHRINE 1 %-1:100000 IJ SOLN
10.0000 mL | Freq: Once | INTRAMUSCULAR | Status: AC
  Administered 2016-05-03: 10 mL
  Filled 2016-05-03: qty 1

## 2016-05-03 NOTE — ED Notes (Signed)
Patient here with right lower leg laceration after cutting same on tin. Dressing applied to same, bleeding controlled

## 2016-05-03 NOTE — ED Provider Notes (Signed)
CSN: [id number]     Arrival date & time 05/03/16  1436 History   First MD Initiated Contact with Patient 05/03/16 1609     No chief complaint on file.    (Consider location/radiation/quality/duration/timing/severity/associated sxs/prior Treatment) HPI   45 year old male with right lower extremity laceration. Wound in the shape of an upside down "Y". Happened shortly before arrival. He states that he was cut on a piece of tin. Denies any other injuries. No numbness or tingling. Tetanus is up-to-date. Has been ambulatory on it without much difficulty.  Past Medical History  Diagnosis Date  . Depression   . Bipolar 1 disorder [facility](HCC)    Past Surgical History  Procedure Laterality Date  . Head surgery    . Eye surgery      Traumatic   Family History  Problem Relation Age of Onset  . Hypertension Maternal Uncle   . Heart disease Maternal Grandmother   . Hypertension Maternal Grandmother   . Diabetes Maternal Grandmother   . Heart failure Maternal Grandmother    Social History  Substance Use Topics  . Smoking status: Current Every Day Smoker -- 0.50 packs/day for 30 years    Types: Cigarettes  . Smokeless tobacco: Never Used  . Alcohol Use: 0.0 oz/week    0 Standard drinks or equivalent per week     Comment: 2 times a week    Review of Systems  All systems reviewed and negative, other than as noted in HPI.   Allergies  Review of patient's allergies indicates no known allergies.  Home Medications   Prior to Admission medications   Not on File   BP 132/69 mmHg  Pulse 79  Temp(Src) 99.4 F (37.4 C) (Oral)  Resp 18  SpO2 100% Physical Exam  Constitutional: He appears well-developed and well-nourished. No distress.  HENT:  Head: Normocephalic and atraumatic.  Eyes: Conjunctivae are normal. Right eye exhibits no discharge. Left eye exhibits no discharge.  Neck: Neck supple.  Cardiovascular: Normal rate, regular rhythm and normal heart sounds.  Exam reveals no  gallop and no friction rub.   No murmur heard. Pulmonary/Chest: Effort normal and breath sounds normal. No respiratory distress.  Abdominal: Soft. He exhibits no distension. There is no tenderness.  Musculoskeletal: He exhibits no edema or tenderness.       Legs: Neurological: He is alert.  Skin: Skin is warm and dry.  Psychiatric: He has a normal mood and affect. His behavior is normal. Thought content normal.  Nursing note and vitals reviewed.   ED Course  Procedures (including critical care time)  LACERATION REPAIR Performed by: Raeford RazorKOHUT, Sneha Willig Authorized by: Raeford RazorKOHUT, Levent Kornegay Consent: Verbal consent obtained. Risks and benefits: risks, benefits and alternatives were discussed Consent given by: patient Patient identity confirmed: provided demographic data Prepped and Draped in normal sterile fashion Wound explored  Small area of skin down into the dermis was essentially avulsed. I didn't feel that there is any way to repair this and retain viability. This was sharply debrided with scissors. Small amount of undermining was needed to facilitate closure of the wound.  Laceration Location: Right shin  Laceration Length: 8 cm  No Foreign Bodies seen or palpated  Anesthesia: local infiltration  Local anesthetic: lidocaine 1% w/ epi Anesthetic total: 4 ml  Irrigation method: syringe Amount of cleaning: standard  Skin closure: 4-0 prolene  Number of sutures: 13  Technique: Simple interrupted and horizontal mattress   Patient tolerance: Patient tolerated the procedure well with no immediate complications.  Labs Review Labs Reviewed - No data to display  Imaging Review No results found. I have personally reviewed and evaluated these images and lab results as part of my medical decision-making.   EKG Interpretation None      MDM   Final diagnoses:  Leg laceration, right, initial encounter    45 year old male with leg laceration. Tetanus is current. Ear gated.  Prepared. Very low suspicion for underlying fracture. Neurovascularly intact. Continued wound care was discussed as well as return precautions. Outpatient follow-up otherwise for suture removal.    Raeford RazorStephen Alexea Blase, MD 05/08/16 1234

## 2016-05-03 NOTE — Discharge Instructions (Signed)
Laceration Care, Adult  A laceration is a cut that goes through all layers of the skin. The cut also goes into the tissue that is right under the skin. Some cuts heal on their own. Others need to be closed with stitches (sutures), staples, skin adhesive strips, or wound glue. Taking care of your cut lowers your risk of infection and helps your cut to heal better.  HOW TO TAKE CARE OF YOUR CUT  For stitches or staples:  · Keep the wound clean and dry.  · If you were given a bandage (dressing), you should change it at least one time per day or as told by your doctor. You should also change it if it gets wet or dirty.  · Keep the wound completely dry for the first 24 hours or as told by your doctor. After that time, you may take a shower or a bath. However, make sure that the wound is not soaked in water until after the stitches or staples have been removed.  · Clean the wound one time each day or as told by your doctor:    Wash the wound with soap and water.    Rinse the wound with water until all of the soap comes off.    Pat the wound dry with a clean towel. Do not rub the wound.  · After you clean the wound, put a thin layer of antibiotic ointment on it as told by your doctor. This ointment:    Helps to prevent infection.    Keeps the bandage from sticking to the wound.  · Have your stitches or staples removed as told by your doctor.  If your doctor used skin adhesive strips:   · Keep the wound clean and dry.  · If you were given a bandage, you should change it at least one time per day or as told by your doctor. You should also change it if it gets dirty or wet.  · Do not get the skin adhesive strips wet. You can take a shower or a bath, but be careful to keep the wound dry.  · If the wound gets wet, pat it dry with a clean towel. Do not rub the wound.  · Skin adhesive strips fall off on their own. You can trim the strips as the wound heals. Do not remove any strips that are still stuck to the wound. They will  fall off after a while.  If your doctor used wound glue:  · Try to keep your wound dry, but you may briefly wet it in the shower or bath. Do not soak the wound in water, such as by swimming.  · After you take a shower or a bath, gently pat the wound dry with a clean towel. Do not rub the wound.  · Do not do any activities that will make you really sweaty until the skin glue has fallen off on its own.  · Do not apply liquid, cream, or ointment medicine to your wound while the skin glue is still on.  · If you were given a bandage, you should change it at least one time per day or as told by your doctor. You should also change it if it gets dirty or wet.  · If a bandage is placed over the wound, do not let the tape for the bandage touch the skin glue.  · Do not pick at the glue. The skin glue usually stays on for 5-10 days. Then, it   falls off of the skin.  General Instructions   · To help prevent scarring, make sure to cover your wound with sunscreen whenever you are outside after stitches are removed, after adhesive strips are removed, or when wound glue stays in place and the wound is healed. Make sure to wear a sunscreen of at least 30 SPF.  · Take over-the-counter and prescription medicines only as told by your doctor.  · If you were given antibiotic medicine or ointment, take or apply it as told by your doctor. Do not stop using the antibiotic even if your wound is getting better.  · Do not scratch or pick at the wound.  · Keep all follow-up visits as told by your doctor. This is important.  · Check your wound every day for signs of infection. Watch for:    Redness, swelling, or pain.    Fluid, blood, or pus.  · Raise (elevate) the injured area above the level of your heart while you are sitting or lying down, if possible.  GET HELP IF:  · You got a tetanus shot and you have any of these problems at the injection site:    Swelling.    Very bad pain.    Redness.    Bleeding.  · You have a fever.  · A wound that was  closed breaks open.  · You notice a bad smell coming from your wound or your bandage.  · You notice something coming out of the wound, such as wood or glass.  · Medicine does not help your pain.  · You have more redness, swelling, or pain at the site of your wound.  · You have fluid, blood, or pus coming from your wound.  · You notice a change in the color of your skin near your wound.  · You need to change the bandage often because fluid, blood, or pus is coming from the wound.  · You start to have a new rash.  · You start to have numbness around the wound.  GET HELP RIGHT AWAY IF:  · You have very bad swelling around the wound.  · Your pain suddenly gets worse and is very bad.  · You notice painful lumps near the wound or on skin that is anywhere on your body.  · You have a red streak going away from your wound.  · The wound is on your hand or foot and you cannot move a finger or toe like you usually can.  · The wound is on your hand or foot and you notice that your fingers or toes look pale or bluish.     This information is not intended to replace advice given to you by your health care provider. Make sure you discuss any questions you have with your health care provider.     Document Released: 04/14/2008 Document Revised: 03/13/2015 Document Reviewed: 10/23/2014  Elsevier Interactive Patient Education ©2016 [employer]

## 2016-05-19 ENCOUNTER — Encounter (HOSPITAL_COMMUNITY): Payer: Self-pay | Admitting: Emergency Medicine

## 2016-05-19 ENCOUNTER — Emergency Department (HOSPITAL_COMMUNITY)
Admission: EM | Admit: 2016-05-19 | Discharge: 2016-05-19 | Disposition: A | Discharge status: Home / Self Care | Payer: Medicare HMO | Attending: Emergency Medicine | Admitting: Emergency Medicine

## 2016-05-19 DIAGNOSIS — F3160 Bipolar disorder, current episode mixed, unspecified: Secondary | ICD-10-CM

## 2016-05-19 DIAGNOSIS — F1910 Other psychoactive substance abuse, uncomplicated: Secondary | ICD-10-CM | POA: Diagnosis present

## 2016-05-19 DIAGNOSIS — R45851 Suicidal ideations: Secondary | ICD-10-CM | POA: Diagnosis not present

## 2016-05-19 DIAGNOSIS — F1410 Cocaine abuse, uncomplicated: Secondary | ICD-10-CM | POA: Diagnosis not present

## 2016-05-19 DIAGNOSIS — Z5181 Encounter for therapeutic drug level monitoring: Secondary | ICD-10-CM | POA: Insufficient documentation

## 2016-05-19 DIAGNOSIS — F319 Bipolar disorder, unspecified: Secondary | ICD-10-CM | POA: Diagnosis not present

## 2016-05-19 DIAGNOSIS — F17210 Nicotine dependence, cigarettes, uncomplicated: Secondary | ICD-10-CM | POA: Insufficient documentation

## 2016-05-19 LAB — CBC WITH DIFFERENTIAL/PLATELET
BASOS PCT: 0 %
Basophils Absolute: 0 10*3/uL (ref 0.0–0.1)
EOS ABS: 0.1 10*3/uL (ref 0.0–0.7)
EOS PCT: 1 %
HCT: 38.3 % — ABNORMAL LOW (ref 39.0–52.0)
HEMOGLOBIN: 13.4 g/dL (ref 13.0–17.0)
LYMPHS ABS: 1.7 10*3/uL (ref 0.7–4.0)
Lymphocytes Relative: 31 %
MCH: 32 pg (ref 26.0–34.0)
MCHC: 35 g/dL (ref 30.0–36.0)
MCV: 91.4 fL (ref 78.0–100.0)
MONO ABS: 0.5 10*3/uL (ref 0.1–1.0)
MONOS PCT: 9 %
NEUTROS PCT: 59 %
Neutro Abs: 3.3 10*3/uL (ref 1.7–7.7)
PLATELETS: 210 10*3/uL (ref 150–400)
RBC: 4.19 MIL/uL — ABNORMAL LOW (ref 4.22–5.81)
RDW: 13.2 % (ref 11.5–15.5)
WBC: 5.5 10*3/uL (ref 4.0–10.5)

## 2016-05-19 LAB — RAPID URINE DRUG SCREEN, HOSP PERFORMED
Amphetamines: NOT DETECTED
Barbiturates: NOT DETECTED
Benzodiazepines: NOT DETECTED
COCAINE: POSITIVE — AB
OPIATES: NOT DETECTED
TETRAHYDROCANNABINOL: POSITIVE — AB

## 2016-05-19 LAB — COMPREHENSIVE METABOLIC PANEL
ALBUMIN: 4.4 g/dL (ref 3.5–5.0)
ALK PHOS: 55 U/L (ref 38–126)
ALT: 19 U/L (ref 17–63)
AST: 26 U/L (ref 15–41)
Anion gap: 6 (ref 5–15)
BILIRUBIN TOTAL: 1 mg/dL (ref 0.3–1.2)
BUN: 14 mg/dL (ref 6–20)
CO2: 26 mmol/L (ref 22–32)
CREATININE: 1.11 mg/dL (ref 0.61–1.24)
Calcium: 9 mg/dL (ref 8.9–10.3)
Chloride: 104 mmol/L (ref 101–111)
GFR calc Af Amer: >60 mL/min (ref >60)
GFR calc non Af Amer: >60 mL/min (ref >60)
GLUCOSE: 90 mg/dL (ref 65–99)
Potassium: 3.9 mmol/L (ref 3.5–5.1)
SODIUM: 136 mmol/L (ref 135–145)
TOTAL PROTEIN: 7.3 g/dL (ref 6.5–8.1)

## 2016-05-19 LAB — ETHANOL: Alcohol, Ethyl (B): <5 mg/dL (ref <5)

## 2016-05-19 MED ORDER — IBUPROFEN 200 MG PO TABS: 600.0000 mg | ORAL_TABLET | Freq: Three times a day (TID) | ORAL | Status: DC | PRN

## 2016-05-19 MED ORDER — ACETAMINOPHEN 325 MG PO TABS: 650.0000 mg | ORAL_TABLET | ORAL | Status: DC | PRN

## 2016-05-19 NOTE — ED Provider Notes (Signed)
CSN: [id number]     Arrival date & time 05/19/16  0908 History   First MD Initiated Contact with Patient 05/19/16 [phone]     Chief Complaint  Patient presents with  . Detox    HPI Pt has been abusing drugs for three days straight.  He has been using molly, weed, percocet.  He has using for three days straight.  He has not slept much in those days.  The last time he used was an hour or two ago.  He used some molly and weed.    He was also drinking alcohol.   He was in treatment once two years ago.  No thoughts of hurting himself or anyone else at this time but he has thought about.  Last time he thought about it was a few hours ago before he came in.  No specific plan.  He has a history of bipolar disorder.  He has been taking them off and on.  The last time he took them was the other day. Past Medical History  Diagnosis Date  . Depression   . Bipolar 1 disorder [facility](HCC)    Past Surgical History  Procedure Laterality Date  . Head surgery    . Eye surgery      Traumatic   Family History  Problem Relation Age of Onset  . Hypertension Maternal Uncle   . Heart disease Maternal Grandmother   . Hypertension Maternal Grandmother   . Diabetes Maternal Grandmother   . Heart failure Maternal Grandmother    Social History  Substance Use Topics  . Smoking status: Current Every Day Smoker -- 0.50 packs/day for 30 years    Types: Cigarettes  . Smokeless tobacco: Never Used  . Alcohol Use: 0.0 oz/week    0 Standard drinks or equivalent per week     Comment: 2 times a week    Review of Systems  Constitutional: Negative for fever.  Respiratory: Negative for shortness of breath.   Cardiovascular: Negative for chest pain.  Gastrointestinal: Negative for vomiting.      Allergies  Review of patient's allergies indicates no known allergies.  Home Medications   Prior to Admission medications   Not on File   BP 123/80 mmHg  Pulse 81  Temp(Src) 98.8 F (37.1 C) (Oral)  Resp 16  SpO2  97% Physical Exam  Constitutional: He appears well-developed and well-nourished. No distress.  HENT:  Head: Normocephalic and atraumatic.  Right Ear: External ear normal.  Left Ear: External ear normal.  Eyes: Conjunctivae are normal. Right eye exhibits no discharge. Left eye exhibits no discharge. No scleral icterus.  Neck: Neck supple. No tracheal deviation present.  Cardiovascular: Normal rate, regular rhythm and intact distal pulses.   Pulmonary/Chest: Effort normal and breath sounds normal. No stridor. No respiratory distress. He has no wheezes. He has no rales.  Abdominal: Soft. Bowel sounds are normal. He exhibits no distension. There is no tenderness. There is no rebound and no guarding.  Musculoskeletal: He exhibits no edema or tenderness.  Neurological: He is alert. He has normal strength. No cranial nerve deficit (no facial droop, extraocular movements intact, no slurred speech) or sensory deficit. He exhibits normal muscle tone. He displays no seizure activity. Coordination normal.  Skin: Skin is warm and dry. No rash noted.  Psychiatric: His speech is not rapid and/or pressured. He is not withdrawn and not actively hallucinating. Thought content is not delusional. He exhibits a depressed mood. He expresses no suicidal plans and no homicidal  plans.  Thoughts of harming himself   Nursing note and vitals reviewed.   ED Course  Procedures  Labs Review Labs Reviewed  COMPREHENSIVE METABOLIC PANEL  ETHANOL  CBC WITH DIFFERENTIAL/PLATELET  URINE RAPID DRUG SCREEN, HOSP PERFORMED    I have personally reviewed and evaluated these images and lab results as part of my medical decision-making.    MDM   Final diagnoses:  Substance abuse  Suicidal ideation  Mixed bipolar I disorder (HCC)    Patient has a history of bipolar disorder. He presents with complaints of depression and drug abuse. Patient mentions vague thoughts of harming himself but no clear plan.  Considering his  psychiatric history and suicidal thoughts associated with his substance abuse I will consult with behavioral health to see if he warrants inpatient treatment.  Pt was assessed by [facility].  Does not meet inpatient criteria.  Outpatient resources provided    Linwood Dibbles, MD 05/19/16 1226

## 2016-05-19 NOTE — ED Notes (Signed)
Pt presents to ED asking for detox. Pt sts "I've been binging for three days on liquor, molly, percocet, and weed."  Pt sts he hasn't had anything to eat or drink and needs detox. Pt sts "can you help me? I need to be taken to a treatment program now. Can ya'll take me somewhere?" Pt denies SI/HI.  Pt sts he was hearing voices while on drugs last night.
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
## 2016-05-19 NOTE — Discharge Instructions (Signed)
Community Resource Guide Outpatient Counseling/Substance Abuse Adult °The United Way’s “211” is a great source of information about community services available.  Access by dialing 2-1-1 from anywhere in [place], or by website -  www.nc211.org.  ° °Other Local Resources (Updated 11/2015) ° °Crisis Hotlines °  °Services  ° °  °Area Served  °[facility] • Crisis Hotline, available 24 hours a day, 7 days a week: 800-939-5911 [place], [place]  ° [facility] • Crisis Hotline, available 24 hours a day, 7 days a week: 866-275-9552 [place], [place]  °[facility] • Suicide Prevention Hotline, available 24 hours a day, 7 days a week: 800-273-8255 [place], [place]  °[facility] ° • Crisis Hotline, available 24 hours a day, 7 days a week: 336-676-6840 [place], [place] °  °[facility] • Crisis Hotline, available 24 hours a day, 7 days a week: 800-256-2452 All °  °[facility] • Crisis Hotline, available 24 hours a day, 7 days a week: 877-626-1772 All  ° °Other Local Resources (Updated 11/2015) ° °Outpatient Counseling/ Substance Abuse Programs  °Services  ° °  °Address and Phone Number  °[facility] ° • Options include Individual counseling, group counseling, intensive outpatient program (several hours a day, several days a week) °• Offers depression assessments °• Provides methadone maintenance program 336-333-6860 °[address], [address] °[place], [place] [address] °  °[facility] ° • Offers partial hospitalization/day treatment and DUI/DWI programs °• Accepts Medicare, private insurance 336-299-4655 °[address], [address] °[place], [place] [address]  °[facility] ° ° • Services include intensive outpatient program (several hours a day, several days a week), outpatient treatment, DUI/DWI services, family education °• Also has some services specifically for Veterans °• Offers transitional housing   336-886-5594 °[address] °[place], [place] [address] °  °  °[facility] • Accepts Medicare, private pay, and private insurance 336-272-0855 °[address] °[place], [place] [address]  °[facility] • Services include individual counseling, substance abuse intensive outpatient program (several hours a day, several days a week), day treatment °• Accepts Medicare, Medicaid, private insurance 336-271-5888 °[address] °[place], [place] [address]  °[facility] ° • Offers substance abuse intensive outpatient program (several hours a day, several days a week), partial hospitalization program 336-832-9800 °[address] °[place], [place] [address] ° °336-349-4454 °[address] °[place], [place] [address] ° °336-386-3795 °[address] °[place], [place] [address] ° °336-993-6120 °[address] °[place], [place] [address]  °[facility] • Individual counseling only °• Accepts private insurance only 336-292-1510 °[address] °[place], [place] [address]  °[facility] • Methadone maintenance program 800-805-6989 °[address] °[place], [place] [address]  °[facility] • Walk-In Clinic providing substance abuse and mental health counseling °• Accepts Medicaid, Medicare, private insurance °• Offers sliding scale for uninsured 336-342-8316 °[address] °[place], [place]   °[facility] • Offers individual counseling, and intensive in-home services 336-347-7415 °[address] °[place], [place] [address]  °[facility] • Offers individual counseling, family counseling, group therapy, domestic violence counseling, consumer credit counseling °• Accepts Medicare, Medicaid, private insurance °• Offers sliding scale for uninsured 336-387-6161 °[address] °[place], [place] [address] ° °336-889-6161 °[place], [address] °[place],  [address]  °[facility] • Offers individual, family   and group counseling °• 3 locations - [place], [place], and [place] ° 336-899-8800 ° °[address] °[place], [place] [address] ° °[address] °[place], [place] [address] ° °[address] °[place], [place] [address]  °[facility]  ° • Offers psychiatric assessment, 8-week Intensive Outpatient Program (several hours a day, several times a week, daytime or evenings), early recovery group, family Program, medication management °• Private pay or private insurance only 336 -621-3381, or  °800-659-3381 °[address] °[place], [place] [address]  °[facility] • Offers individual, couples and family counseling °• Accepts Medicaid, private insurance, and sliding scale for uninsured 336-542-2076 °[address] °[place], [place] [address]  °David Fuller, MD • Individual counseling °• Private insurance 336-852-4051 °[address] °[place], [place] [address]  °[facility] ° • Offers assessment, substance abuse treatment, and behavioral health treatment 336-878-6098 °[address] °[place], [place] [address]  °[facility] • Individual counseling °• Accepts private insurance 336-272-1972 °[address] °[place], [place] [address]  °[facility] • Individual counseling °• Accepts Medicare, private insurance 336-547-1574 °[address] °[place], [place] [address]  °[facility]  ° • Offers intensive outpatient program (several hours a day, several times a week) °• Private pay, private insurance 877-254-5536 °[address] °[place], [place]  °[facility] • Individual counseling °• Medicare, private insurance 336-505-9494 °[address] °[place], [place] [address]  °[facility]  ° • Offers intensive outpatient program (several hours a day, several times a week) and partial hospitalization  program 336-794-3550 °[address] °[place], [place] [address]  °Parrish McKinney, MD • Individual counseling 336-282-1251 °[address] °[place], [place] [address]  °[facility] • Offers Christian counseling to individuals, couples, and families °• Accepts Medicare and private insurance; offers sliding scale for uninsured 336-288-1484 °[address] °[place], [place] [address]  °[facility] • Christian counseling 336-542-2060 °[address] °[place], [place] [address]  °[facility] ° • Offers crisis counseling, individual counseling, group therapy, in-home therapy, domestic violence services, day treatment, DWI services, Community Support Team (CST), Assertive Community Treatment Team (ACTT), substance abuse Intensive Outpatient Program (several hours a day, several times a week) °• 2 locations - [place] and [place] 336-229-5905 °[address] °[place], [place] [address] ° °336-694-1777 °[address] °[place], [place] [address]  °[facility]  ° ° • Individual counseling and group therapy °• Accepts private insurance, Medicare, Medicaid 336-379-7146 °[address] °[place], [place]  °[facility] • Offers individual and family counseling °• Offers LGBTQ services °• Accepts private insurance and private pay 336-288-9190 °[address] °[place], [place] [address]  °[facility]  ° • Offers individual counseling, group therapy, and outpatient detox °• Accepts private insurance 336-389-1413 °[address] °[place], [place]  °[facility] • Individual counseling °• Accepts Medicare, private insurance 336-632-3505 °[address] °[place], [place] [address]  °[facility] • Individual counseling °• Accepts Medicare, private insurance 336-570-0104 °[address] °[place], [place] [address]  °[facility] ° • Offers substance abuse  Intensive Outpatient Program (several hours a day, several times a week) 336-323-1385, or °[phone] °[place],   ° °

## 2016-05-21 ENCOUNTER — Emergency Department (HOSPITAL_COMMUNITY)
Admission: EM | Admit: 2016-05-21 | Discharge: 2016-05-21 | Disposition: A | Discharge status: Home / Self Care | Payer: Medicare HMO | Attending: Emergency Medicine | Admitting: Emergency Medicine

## 2016-05-21 ENCOUNTER — Encounter (HOSPITAL_COMMUNITY): Payer: Self-pay | Admitting: *Deleted

## 2016-05-21 DIAGNOSIS — F17210 Nicotine dependence, cigarettes, uncomplicated: Secondary | ICD-10-CM | POA: Diagnosis not present

## 2016-05-21 DIAGNOSIS — Z4802 Encounter for removal of sutures: Secondary | ICD-10-CM | POA: Diagnosis not present

## 2016-05-21 DIAGNOSIS — F319 Bipolar disorder, unspecified: Secondary | ICD-10-CM | POA: Insufficient documentation

## 2016-05-21 NOTE — ED Notes (Signed)
PT resents today for suture removal o n RT anterior lower leg. Skin dry and in tact

## 2016-05-21 NOTE — Discharge Instructions (Signed)

## 2016-05-21 NOTE — ED Notes (Signed)
Declined W/C at D/C and was escorted to lobby by RN. 

## 2016-05-21 NOTE — ED Provider Notes (Signed)
CSN: [id number]     Arrival date & time 05/21/16  1232 History  By signing my name below, I, [facility], attest that this documentation has been prepared under the direction and in the presence of Roxy Horseman, PA-C. Electronically Signed: Randell Patient, ED Scribe. 05/21/2016. 12:54 PM.    Chief Complaint  Patient presents with  . Suture / Staple Removal    The history is provided by the patient. No language interpreter was used.   HPI Comments: Charles Johnson is a 45 y.o. male who presents to the Emergency Department for a suture removal after a laceration repair that occurred 18 days ago. Pt states that he lacerated his right lower leg with aluminum last month that was repaired in the ED without complications. Denies erythema to the affected area or pain to the lower right leg.  Past Medical History  Diagnosis Date  . Depression   . Bipolar 1 disorder [facility])    Past Surgical History  Procedure Laterality Date  . Head surgery    . Eye surgery      Traumatic   Family History  Problem Relation Age of Onset  . Hypertension Maternal Uncle   . Heart disease Maternal Grandmother   . Hypertension Maternal Grandmother   . Diabetes Maternal Grandmother   . Heart failure Maternal Grandmother    Social History  Substance Use Topics  . Smoking status: Current Every Day Smoker -- 0.50 packs/day for 30 years    Types: Cigarettes  . Smokeless tobacco: Never Used  . Alcohol Use: 0.0 oz/week    0 Standard drinks or equivalent per week     Comment: 2 times a week    Review of Systems  Musculoskeletal: Negative for myalgias.  Skin: Positive for wound (healed right leg laceration). Negative for color change.      Allergies  Review of patient's allergies indicates no known allergies.  Home Medications   Prior to Admission medications   Not on File   BP 126/83 mmHg  Pulse 65  Temp(Src) 98.4 F (36.9 C) (Oral)  Resp 20  SpO2 100% Physical Exam   Constitutional: He is oriented to person, place, and time. He appears well-developed and well-nourished.  HENT:  Head: Normocephalic and atraumatic.  Eyes: Conjunctivae and EOM are normal.  Neck: Normal range of motion.  Cardiovascular: Normal rate.   Pulmonary/Chest: Effort normal.  Abdominal: He exhibits no distension.  Musculoskeletal: Normal range of motion.  Neurological: He is alert and oriented to person, place, and time.  Skin: Skin is dry.  Well healing wound to right distal shin, sutures in place  Psychiatric: He has a normal mood and affect. His behavior is normal. Judgment and thought content normal.  Nursing note and vitals reviewed.   ED Course  Procedures  DIAGNOSTIC STUDIES: Oxygen Saturation is 100% on RA, normal by my interpretation.    COORDINATION OF CARE: 12:47 PM Discussed treatment plan with pt at bedside and pt agreed to plan.   SUTURE REMOVAL Performed by: Roxy Horseman  Consent: Verbal consent obtained. Consent given by: patient Required items: required blood products, implants, devices, and special equipment available Time out: Immediately prior to procedure a "time out" was called to verify the correct patient, procedure, equipment, support staff and site/side marked as required.  Location: Right shin  Wound Appearance: clean  Sutures/Staples Removed: 13  Patient tolerance: Patient tolerated the procedure well with no immediate complications.     MDM   Final diagnoses:  Visit  for suture removal    Suture removal. Wound is well healing. No further follow-up.  I personally performed the services described in this documentation, which was scribed in my presence. The recorded information has been reviewed and is accurate.      Roxy HorsemanRobert Meklit Cotta, PA-C 05/21/16 1258  Gerhard Munchobert Lockwood, MD 05/22/16 [id number]

## 2023-11-16 ENCOUNTER — Encounter (HOSPITAL_COMMUNITY): Payer: Self-pay

## 2023-11-16 ENCOUNTER — Ambulatory Visit (HOSPITAL_COMMUNITY)
Admission: EM | Admit: 2023-11-16 | Discharge: 2023-11-16 | Disposition: A | Discharge status: Home / Self Care | Payer: Medicaid Other | Attending: Family Medicine | Admitting: Family Medicine

## 2023-11-16 DIAGNOSIS — I10 Essential (primary) hypertension: Secondary | ICD-10-CM

## 2023-11-16 MED ORDER — HYDROCHLOROTHIAZIDE 12.5 MG PO TABS: 12.5000 mg | ORAL_TABLET | Freq: Every day | ORAL | 0 refills | Status: DC

## 2023-11-16 NOTE — ED Provider Notes (Signed)
 [facility]    CSN: [id number] Arrival date & time: 11/16/23  9085      History   Chief Complaint Chief Complaint  Patient presents with   Medication Refill    HPI Charles Johnson is a 53 y.o. male.    Medication Refill Here for a refill on his HCTZ that he takes for hypertension.  He has been out about 5 or 6 days.  No headache or chest pain and no edema.  NKDA  He does have an appointment to establish with primary care on January 8.  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 Authorizing Provider  hydrochlorothiazide  (HYDRODIURIL ) 12.5 MG tablet Take 1 tablet (12.5 mg total) by mouth daily. 11/16/23  Yes Vonna Sharlet POUR, MD    Family History Family History  Problem Relation Age of Onset   Hypertension Maternal Uncle    Heart disease Maternal Grandmother    Hypertension Maternal Grandmother    Diabetes Maternal Grandmother    Heart failure Maternal Grandmother     Social History Social History   Tobacco Use   Smoking status: Every Day    Current packs/day: 0.50    Average packs/day: 0.5 packs/day for 30.0 years (15.0 ttl pk-yrs)    Types: Cigarettes   Smokeless tobacco: Never  Substance Use Topics   Alcohol use: Yes    Alcohol/week: 0.0 standard drinks of alcohol    Comment: 2 times a week   Drug use: Yes    Types: Marijuana    Comment: occasionally     Allergies   Patient has no known allergies.   Review of Systems Review of Systems   Physical Exam Triage Vital Signs ED Triage Vitals [11/16/23 1038]  Encounter Vitals Group     BP  133/86     Systolic BP Percentile      Diastolic BP Percentile      Pulse Rate 69     Resp 18     Temp 98.5 F (36.9 C)     Temp Source Oral     SpO2 98 %     Weight      Height      Head Circumference      Peak Flow      Pain Score 0     Pain Loc      Pain Education      Exclude from Growth Chart    No data found.  Updated Vital Signs BP 133/86 (BP Location: Left Arm)   Pulse 69   Temp 98.5 F (36.9 C) (Oral)   Resp 18   SpO2 98%   Visual Acuity Right Eye Distance:   Left Eye Distance:   Bilateral Distance:    Right Eye Near:   Left Eye Near:    Bilateral Near:     Physical Exam Vitals reviewed.  Constitutional:      General: He is not in acute distress.    Appearance: He is not ill-appearing, toxic-appearing or  diaphoretic.  Cardiovascular:     Rate and Rhythm: Normal rate and regular rhythm.     Heart sounds: No murmur heard. Pulmonary:     Effort: Pulmonary effort is normal.     Breath sounds: Normal breath sounds.  Skin:    Coloration: Skin is not pale.  Neurological:     Mental Status: He is alert.  Psychiatric:        Behavior: Behavior normal.      UC Treatments / Results  Labs (all labs ordered are listed, but only abnormal results are displayed) Labs Reviewed - No data to display  EKG   Radiology No results found.  Procedures Procedures (including critical care time)  Medications Ordered in UC Medications - No data to display  Initial Impression / Assessment and Plan / UC Course  I have reviewed the triage vital signs and the nursing notes.  Pertinent labs & imaging results that were available during my care of the patient were reviewed by me and considered in my medical decision making (see chart for details).     HCTZ 12.5 mg is sent in for a month supply. Final Clinical Impressions(s) / UC Diagnoses   Final diagnoses:  Essential hypertension     Discharge Instructions      Hydrochlorothiazide  12.5 mg--1 tablet  daily for hypertension  Keep your appointment with primary care to establish.     ED Prescriptions     Medication Sig Dispense Auth. Provider   hydrochlorothiazide  (HYDRODIURIL ) 12.5 MG tablet Take 1 tablet (12.5 mg total) by mouth daily. 30 tablet Malichi Palardy K, MD      PDMP not reviewed this encounter.   Vonna Sharlet POUR, MD 11/16/23 1054

## 2023-11-16 NOTE — ED Triage Notes (Addendum)
 Pt states got out of jail on 12/31 and haven't had his hydrochlorothiazide 12.5mg  since. Requesting refill.

## 2023-11-16 NOTE — Discharge Instructions (Signed)
 Hydrochlorothiazide 12.5 mg--1 tablet daily for hypertension  Keep your appointment with primary care to establish.

## 2024-01-14 ENCOUNTER — Telehealth: Payer: Self-pay

## 2024-01-14 NOTE — Telephone Encounter (Signed)
 Copied from CRM [id number]. Topic: Appointments - Scheduling Inquiry for Clinic >> Jan 14, 2024  2:55 PM Ivette P wrote: Reason for CRM: Phoebe Sharps, Mom, called in to schedule pt. Pt has had a history of problems and needs to be sooner than May. Pt was in hospital on 01/06   Mom said the following, does not have exact dates but can provide further in detail when son is available:  "has had 2 strokes last one, he looses feeling on the left side, fell side of face and had surgery  and put wires. feels as if it has not been done right. high blood pressure pills. cholesteral issues."  [place] [phone]

## 2024-01-14 NOTE — Telephone Encounter (Signed)
Not a patient of this practice

## 2024-01-22 ENCOUNTER — Ambulatory Visit (HOSPITAL_COMMUNITY)
Admission: EM | Admit: 2024-01-22 | Discharge: 2024-01-22 | Disposition: A | Discharge status: Home / Self Care | Attending: Emergency Medicine | Admitting: Emergency Medicine

## 2024-01-22 ENCOUNTER — Encounter (HOSPITAL_COMMUNITY): Payer: Self-pay

## 2024-01-22 DIAGNOSIS — Z760 Encounter for issue of repeat prescription: Secondary | ICD-10-CM

## 2024-01-22 MED ORDER — HYDROCHLOROTHIAZIDE 12.5 MG PO TABS: 12.5000 mg | ORAL_TABLET | Freq: Every day | ORAL | 0 refills | Status: DC

## 2024-01-22 NOTE — ED Provider Notes (Signed)
 [facility]    CSN: [id number] Arrival date & time: 01/22/24  0855      History   Chief Complaint Chief Complaint  Patient presents with   Medication Refill    HPI Charles Johnson is a 53 y.o. male.   Patient presents requesting refill of his hydrochlorothiazide.  Patient states he has been without his medication for 1 month.  Denies headache, blurred vision, dizziness, chest pain, weakness, numbness, and confusion.  Patient was seen here in January and given refill at that time and stated that he had a primary care appointment on 1/8. Patient states that this appointment was rescheduled for 3/18.   Medication Refill   Past Medical History:  Diagnosis Date   Bipolar 1 disorder [facility])    Depression     Patient Active Problem List   Diagnosis Date Noted   Hemorrhoids 05/28/2015   Mixed bipolar I disorder (HCC) 05/28/2015   Syncope 05/11/2015    Past Surgical History:  Procedure Laterality Date   EYE SURGERY     Traumatic   head surgery         Home Medications    Prior to Admission medications   Medication Sig Start Date End Date Taking? Authorizing Provider  hydrochlorothiazide (HYDRODIURIL) 12.5 MG tablet Take 1 tablet (12.5 mg total) by mouth daily. 01/22/24   Letta Kocher, NP    Family History Family History  Problem Relation Age of Onset   Hypertension Maternal Uncle    Heart disease Maternal Grandmother    Hypertension Maternal Grandmother    Diabetes Maternal Grandmother    Heart failure Maternal Grandmother     Social History Social History   Tobacco Use   Smoking status: Every Day    Current packs/day: 0.50    Average packs/day: 0.5 packs/day for 30.0 years (15.0 ttl pk-yrs)    Types: Cigarettes   Smokeless tobacco: Never  Substance Use Topics   Alcohol use: Yes    Alcohol/week: 0.0 standard drinks of alcohol    Comment: 2 times a week   Drug use: Yes    Types: Marijuana    Comment: occasionally      Allergies   Patient has no known allergies.   Review of Systems Review of Systems  Per HPI  Physical Exam Triage Vital Signs ED Triage Vitals [01/22/24 0938]  Encounter Vitals Group     BP (!) 132/91     Systolic BP Percentile      Diastolic BP Percentile      Pulse Rate 68     Resp 16     Temp 98.2 F (36.8 C)     Temp Source Oral     SpO2 99 %     Weight 210 lb (95.3 kg)     Height 5' 7.5" (1.715 m)     Head Circumference      Peak Flow      Pain Score 0     Pain Loc      Pain Education      Exclude from Growth Chart    No data found.  Updated Vital Signs BP (!) 132/91 (BP Location: Right Arm)   Pulse 68   Temp 98.2 F (36.8 C) (Oral)   Resp 16   Ht 5' 7.5" (1.715 m)   Wt 210 lb (95.3 kg)   SpO2 99%   BMI 32.41 kg/m   Visual Acuity Right Eye Distance:   Left Eye Distance:   Bilateral Distance:  Right Eye Near:   Left Eye Near:    Bilateral Near:     Physical Exam Vitals and nursing note reviewed.  Constitutional:      General: He is awake. He is not in acute distress.    Appearance: Normal appearance. He is well-developed and well-groomed. He is not ill-appearing.  Eyes:     Extraocular Movements: Extraocular movements intact.     Pupils: Pupils are equal, round, and reactive to light.  Musculoskeletal:        General: Normal range of motion.  Skin:    General: Skin is warm and dry.  Neurological:     General: No focal deficit present.     Mental Status: He is alert and oriented to person, place, and time. Mental status is at baseline.     GCS: GCS eye subscore is 4. GCS verbal subscore is 5. GCS motor subscore is 6.  Psychiatric:        Behavior: Behavior is cooperative.      UC Treatments / Results  Labs (all labs ordered are listed, but only abnormal results are displayed) Labs Reviewed - No data to display  EKG   Radiology No results found.  Procedures Procedures (including critical care time)  Medications  Ordered in UC Medications - No data to display  Initial Impression / Assessment and Plan / UC Course  I have reviewed the triage vital signs and the nursing notes.  Pertinent labs & imaging results that were available during my care of the patient were reviewed by me and considered in my medical decision making (see chart for details).     Patient presents requesting refill of hydrochlorothiazide.  No significant findings upon exam.  No neurodeficits noted.  GCS 15.  Patient states that he has an appointment with his primary care provider on 3/18.  Prescribe 2-week supply of hydrochlorothiazide.  Discussed follow-up and return precautions. Final Clinical Impressions(s) / UC Diagnoses   Final diagnoses:  Encounter for medication refill     Discharge Instructions      I have prescribed a 2-week supply of your hydrochlorothiazide.  Please keep your appointment on 3/18 with your primary care provider so they can further manage your blood pressure and provide you refills of your blood pressure medication.  Return here as needed.    ED Prescriptions     Medication Sig Dispense Auth. Provider   hydrochlorothiazide (HYDRODIURIL) 12.5 MG tablet Take 1 tablet (12.5 mg total) by mouth daily. 14 tablet Wynonia Lawman A, NP      PDMP not reviewed this encounter.   Wynonia Lawman A, NP 01/22/24 1000

## 2024-01-22 NOTE — Discharge Instructions (Signed)
 I have prescribed a 2-week supply of your hydrochlorothiazide.  Please keep your appointment on 3/18 with your primary care provider so they can further manage your blood pressure and provide you refills of your blood pressure medication.  Return here as needed.

## 2024-01-22 NOTE — ED Triage Notes (Signed)
 Patient needing a refill of his blood pressure medication HCTZ. Patient ran out of the medication one month ago. Patient denies any symptoms such as vision changes or headaches.

## 2024-09-27 ENCOUNTER — Ambulatory Visit: Payer: Self-pay

## 2024-09-27 NOTE — Telephone Encounter (Signed)
 FYI Only or Action Required?: FYI only for provider: appointment scheduled on 09/27/2024.  Patient was last seen in primary care on 09/27/2024.  Called Nurse Triage reporting Neurologic Problem.  Symptoms began several weeks ago.  Interventions attempted: Nothing.  Symptoms are: gradually worsening.  Triage Disposition: See PCP When Office is Open (Within 3 Days)  Patient/caregiver understands and will follow disposition?: Yes   Copied from CRM [id number]. Topic: Clinical - Red Word Triage >> Sep 27, 2024  3:09 PM Ashley R wrote: Red Word that prompted transfer to Nurse Triage: Problems seeing, occasional jerking of limbs/body, balling hands up and shaking,  previous history of strokes and passing out blistering on feet/toes, hives/broken out on back and arms. Hypertension, neurological concerns, unhappy with virtual visits - wants to be seen in person.   Requesting transfer to [facility], next available provider. Transfer from Dr Aliene, A ----------------------------------------------------------------------- From previous Reason for Contact - Scheduling: Patient/patient representative is calling to schedule an appointment. Refer to attachments for appointment information. Reason for Disposition  [1] Loss of speech or garbled speech AND [2] is a chronic symptom (recurrent or ongoing AND present > 4 weeks)  Answer Assessment - Initial Assessment Questions 1. SYMPTOM: What is the main symptom you are concerned about? (e.g., weakness, numbness)     Left shoulder weakness 2. ONSET: When did this start? (e.g., minutes, hours, days; while sleeping)     X several weeks 3. LAST NORMAL: When was the last time you (the patient) were normal (no symptoms)?     unknown 4. PATTERN Does this come and go, or has it been constant since it started?  Is it present now?     Comes and foes 5. CARDIAC SYMPTOMS: Have you had any of the following symptoms: chest pain, difficulty breathing,  palpitations?     no 6. NEUROLOGIC SYMPTOMS: Have you had any of the following symptoms: headache, dizziness, vision loss, double vision, changes in speech, unsteady on your feet?     Blurred vision, balls up hands and shakes 7. OTHER SYMPTOMS: Do you have any other symptoms?     Blisters on right foot, hives on back and arms 8. PREGNANCY: Is there any chance you are pregnant? When was your last menstrual period?     na  New onset of Jerking last about 3 seconds stops - it happens occasionally but just started in the last few weeks.  Constipation daily.  Blisters on right foot.  Protocols used: Neurologic Deficit-A-AH

## 2024-09-28 ENCOUNTER — Ambulatory Visit (INDEPENDENT_AMBULATORY_CARE_PROVIDER_SITE_OTHER): Admitting: General Practice

## 2024-09-28 ENCOUNTER — Encounter: Payer: Self-pay | Admitting: General Practice

## 2024-09-28 VITALS — BP 122/80 | HR 75 | Temp 98.2°F | Ht 67.5 in | Wt 194.0 lb

## 2024-09-28 DIAGNOSIS — Z7689 Persons encountering health services in other specified circumstances: Secondary | ICD-10-CM | POA: Diagnosis not present

## 2024-09-28 DIAGNOSIS — I10 Essential (primary) hypertension: Secondary | ICD-10-CM | POA: Diagnosis not present

## 2024-09-28 DIAGNOSIS — E785 Hyperlipidemia, unspecified: Secondary | ICD-10-CM | POA: Diagnosis not present

## 2024-09-28 DIAGNOSIS — F3160 Bipolar disorder, current episode mixed, unspecified: Secondary | ICD-10-CM

## 2024-09-28 DIAGNOSIS — Z8673 Personal history of transient ischemic attack (TIA), and cerebral infarction without residual deficits: Secondary | ICD-10-CM | POA: Insufficient documentation

## 2024-09-28 DIAGNOSIS — Z716 Tobacco abuse counseling: Secondary | ICD-10-CM | POA: Insufficient documentation

## 2024-09-28 DIAGNOSIS — Z1159 Encounter for screening for other viral diseases: Secondary | ICD-10-CM

## 2024-09-28 DIAGNOSIS — R259 Unspecified abnormal involuntary movements: Secondary | ICD-10-CM | POA: Insufficient documentation

## 2024-09-28 DIAGNOSIS — Z7185 Encounter for immunization safety counseling: Secondary | ICD-10-CM | POA: Insufficient documentation

## 2024-09-28 DIAGNOSIS — Z114 Encounter for screening for human immunodeficiency virus [HIV]: Secondary | ICD-10-CM

## 2024-09-28 LAB — COMPREHENSIVE METABOLIC PANEL WITH GFR
ALT: 14 U/L (ref 0–53)
AST: 16 U/L (ref 0–37)
Albumin: 3.9 g/dL (ref 3.5–5.2)
Alkaline Phosphatase: 67 U/L (ref 39–117)
BUN: 16 mg/dL (ref 6–23)
CO2: 30 meq/L (ref 19–32)
Calcium: 9.2 mg/dL (ref 8.4–10.5)
Chloride: 107 meq/L (ref 96–112)
Creatinine, Ser: 0.93 mg/dL (ref 0.40–1.50)
GFR: 92.87 mL/min (ref >60.00)
Glucose, Bld: 65 mg/dL — ABNORMAL LOW (ref 70–99)
Potassium: 4.5 meq/L (ref 3.5–5.1)
Sodium: 142 meq/L (ref 135–145)
Total Bilirubin: 0.4 mg/dL (ref 0.2–1.2)
Total Protein: 6.3 g/dL (ref 6.0–8.3)

## 2024-09-28 LAB — LIPID PANEL
Cholesterol: 166 mg/dL (ref 0–200)
HDL: 52.7 mg/dL (ref >39.00)
LDL Cholesterol: 90 mg/dL (ref 0–99)
NonHDL: 112.91
Total CHOL/HDL Ratio: 3
Triglycerides: 114 mg/dL (ref 0.0–149.0)
VLDL: 22.8 mg/dL (ref 0.0–40.0)

## 2024-09-28 LAB — CBC
HCT: 39.9 % (ref 39.0–52.0)
Hemoglobin: 13.3 g/dL (ref 13.0–17.0)
MCHC: 33.4 g/dL (ref 30.0–36.0)
MCV: 95.7 fl (ref 78.0–100.0)
Platelets: 189 K/uL (ref 150.0–400.0)
RBC: 4.17 Mil/uL — ABNORMAL LOW (ref 4.22–5.81)
RDW: 14.1 % (ref 11.5–15.5)
WBC: 4.7 K/uL (ref 4.0–10.5)

## 2024-09-28 LAB — TSH: TSH: 1.22 u[IU]/mL (ref 0.35–5.50)

## 2024-09-28 MED ORDER — ARIPIPRAZOLE 10 MG PO TABS: 10.0000 mg | ORAL_TABLET | Freq: Every day | ORAL | 2 refills | Status: DC

## 2024-09-28 MED ORDER — HYDROCHLOROTHIAZIDE 12.5 MG PO TABS: 12.5000 mg | ORAL_TABLET | Freq: Every day | ORAL | 0 refills | Status: DC

## 2024-09-28 MED ORDER — ATORVASTATIN CALCIUM 20 MG PO TABS: 20.0000 mg | ORAL_TABLET | Freq: Every day | ORAL | 0 refills | Status: DC

## 2024-09-28 NOTE — Progress Notes (Signed)
 New Patient Office Visit  Subjective    Patient ID: Charles Johnson, male    DOB: [birth year]  Age: 53 y.o. MRN: [id number]  CC:  Chief Complaint  Patient presents with   New Patient (Initial Visit)    HPI Charles Johnson is a 53 y.o. male presents to establish care. Previous pcp/physical/labs: Dr. Aliene Physical/labs: five months ago. No recent labs.   Discussed the use of AI scribe software for clinical note transcription with the patient, who gave verbal consent to proceed.  History of Present Illness He takes hydrochlorothiazide  12.5 mg daily for hypertension, with home blood pressure readings typically around 112/80 mmHg. He experiences occasional blurred vision, which he associates with his blood pressure. He has not been consistent with annual eye exams.  He is on atorvastatin  20 mg for high cholesterol. He is unsure of the last cholesterol check but recalls a physical exam 3-5 months ago. No chest pain, shortness of breath, or numbness.   He takes Abilify  10 mg for mood management and follows with a psychiatrist, although he has not seen him recently due to attempts to change providers.  He has a history of stroke last year while incarcerated, requiring hospitalization and rehabilitation at [facility]. He reports residual right-sided weakness, numbness on the side of his face, and shoulder pain from a fall during the stroke. He wore a heart monitor prior to the stroke due to chest pains.  He smokes 6-7 cigarettes per week, typically when stressed, and denies marijuana use due to being on parole. He is scheduled to be off parole by Christmas Eve.  No fever, chills, chest pain, shortness of breath, dizziness, headaches, depression, anxiety, urinary symptoms, bowel movement issues, diarrhea, nausea, or constipation.     Outpatient Encounter Medications as of 09/28/2024  Medication Sig   [DISCONTINUED] ARIPiprazole  (ABILIFY ) 10 MG tablet Take 10 mg by mouth daily.    [DISCONTINUED] atorvastatin  (LIPITOR) 20 MG tablet Take 20 mg by mouth.   [DISCONTINUED] hydrochlorothiazide  (HYDRODIURIL ) 12.5 MG tablet Take 1 tablet (12.5 mg total) by mouth daily.   ARIPiprazole  (ABILIFY ) 10 MG tablet Take 1 tablet (10 mg total) by mouth daily.   atorvastatin  (LIPITOR) 20 MG tablet Take 1 tablet (20 mg total) by mouth daily.   hydrochlorothiazide  (HYDRODIURIL ) 12.5 MG tablet Take 1 tablet (12.5 mg total) by mouth daily.   No facility-administered encounter medications on file as of 09/28/2024.    Past Medical History:  Diagnosis Date   Bipolar 1 disorder (HCC)    Depression    Primary hypertension 09/28/2024    Past Surgical History:  Procedure Laterality Date   EYE SURGERY     Traumatic   head surgery      Family History  Problem Relation Age of Onset   Hypertension Maternal Uncle    Heart disease Maternal Grandmother    Hypertension Maternal Grandmother    Diabetes Maternal Grandmother    Heart failure Maternal Grandmother     Social History   Socioeconomic History   Marital status: Single    Spouse name: Not on file   Number of children: 1   Years of education: 12   Highest education level: 12th grade  Occupational History   Occupation: Disability    Comment: Bipolar  Tobacco Use   Smoking status: Some Days    Current packs/day: 0.50    Average packs/day: 0.5 packs/day for 30.0 years (15.0 ttl pk-yrs)    Types: Cigarettes   Smokeless tobacco:  Never  Substance and Sexual Activity   Alcohol use: Yes    Alcohol/week: 3.0 standard drinks of alcohol    Types: 3 Standard drinks or equivalent per week    Comment: 2 times a week   Drug use: Yes    Types: Marijuana    Comment: occasionally   Sexual activity: Yes  Other Topics Concern   Not on file  Social History Narrative   Fun: Read   Denies religious beliefs effecting health care.    Social Drivers of Health   Financial Resource Strain: Medium Risk (09/27/2024)   Overall  Financial Resource Strain (CARDIA)    Difficulty of Paying Living Expenses: Somewhat hard  Food Insecurity: No Food Insecurity (09/27/2024)   Hunger Vital Sign    Worried About Running Out of Food in the Last Year: Never true    Ran Out of Food in the Last Year: Never true  Transportation Needs: No Transportation Needs (09/27/2024)   PRAPARE - Administrator, Civil Service (Medical): No    Lack of Transportation (Non-Medical): No  Physical Activity: Sufficiently Active (09/27/2024)   Exercise Vital Sign    Days of Exercise per Week: 6 days    Minutes of Exercise per Session: 30 min  Stress: Stress Concern Present (09/27/2024)   [employer] of Occupational Health - Occupational Stress Questionnaire    Feeling of Stress: Rather much  Social Connections: Moderately Isolated (09/27/2024)   Social Connection and Isolation Panel    Frequency of Communication with Friends and Family: More than three times a week    Frequency of Social Gatherings with Friends and Family: More than three times a week    Attends Religious Services: More than 4 times per year    Active Member of [employer] or Organizations: No    Attends Engineer, Structural: Not on file    Marital Status: Never married  Intimate Partner Violence: Not on file    Review of Systems  Constitutional:  Negative for chills and fever.  Respiratory:  Negative for shortness of breath.   Cardiovascular:  Negative for chest pain.  Gastrointestinal:  Negative for abdominal pain, constipation, diarrhea, heartburn, nausea and vomiting.  Genitourinary:  Negative for dysuria, frequency and urgency.  Neurological:  Negative for dizziness and headaches.  Endo/Heme/Allergies:  Negative for polydipsia.  Psychiatric/Behavioral:  Negative for depression and suicidal ideas. The patient is not nervous/anxious.         Objective    BP 122/80   Pulse 75   Temp 98.2 F (36.8 C) (Oral)   Ht 5' 7.5 (1.715 m)   Wt 194  lb (88 kg)   SpO2 98%   BMI 29.94 kg/m   Physical Exam Vitals and nursing note reviewed.  Constitutional:      Appearance: Normal appearance.  Cardiovascular:     Rate and Rhythm: Normal rate and regular rhythm.     Pulses: Normal pulses.     Heart sounds: Normal heart sounds.  Pulmonary:     Effort: Pulmonary effort is normal.     Breath sounds: Normal breath sounds.  Neurological:     General: No focal deficit present.     Mental Status: He is alert and oriented to person, place, and time.     Cranial Nerves: Cranial nerves 2-12 are intact.     Sensory: Sensation is intact.     Motor: Motor function is intact.     Coordination: Coordination is intact.  Gait: Gait is intact.     Deep Tendon Reflexes:     Reflex Scores:      Patellar reflexes are 2+ on the right side and 2+ on the left side. Psychiatric:        Mood and Affect: Mood normal.        Behavior: Behavior normal.        Thought Content: Thought content normal.        Judgment: Judgment normal.         Assessment & Plan:  Primary hypertension -     hydroCHLOROthiazide ; Take 1 tablet (12.5 mg total) by mouth daily.  Dispense: 90 tablet; Refill: 0 -     CBC -     Comprehensive metabolic panel with GFR  Establishing care with new doctor, encounter for Assessment & Plan: EMR reviewed briefly.   Release sent for records from [facility] and Dr. Aliene.    Hyperlipidemia, unspecified hyperlipidemia type -     Atorvastatin  Calcium ; Take 1 tablet (20 mg total) by mouth daily.  Dispense: 30 tablet; Refill: 0 -     Lipid panel -     Hemoglobin A1c -     TSH  Mixed bipolar I disorder (HCC) -     ARIPiprazole ; Take 1 tablet (10 mg total) by mouth daily.  Dispense: 30 tablet; Refill: 2  History of stroke -     Ambulatory referral to Neurology  Tobacco abuse counseling Assessment & Plan: Smoking cessation instruction/counseling given:  counseled patient on the dangers of tobacco use, advised patient to  stop smoking, and reviewed strategies to maximize success    Need for hepatitis C screening test -     Hepatitis C antibody  Screening for HIV (human immunodeficiency virus) -     HIV Antibody (routine testing w rflx)  Abnormal involuntary movements  Vaccine counseling    Assessment and Plan Assessment & Plan Essential hypertension Long-standing hypertension managed with hydrochlorothiazide .  - Continue hydrochlorothiazide  12.5 mg once daily. Rx sent. - Advised to monitor blood pressure at home. - CMP pending.  Hyperlipidemia Managed with atorvastatin . Last cholesterol check 3-5 months ago, specific values unknown. Recent cardiovascular symptoms reported. - Continue atorvastatin  20 mg once daily. Rx sent. - Lipid panel pending.  Bipolar disorder Managed with Abilify . Follow-up with psychiatrist needed. - Continue Abilify  10 mg.  - Encouraged follow-up with psychiatrist.  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  - Poor historian. - neuro exam stable.  - Referred to neurologist.   Abnormal involuntary movements (limb/body jerking) Occasional jerking of limbs and body.  - no known hx of seizures.  - Poor historian. - neuro exam stable.  - Referred to neurologist.   Tobacco use Smokes 6-7 cigarettes per week, primarily when stressed. Advised to cut back due to stroke history. - Advised to reduce cigarette smoking.    Return in about 4 weeks (around 10/26/2024) for physical .   Carrol Aurora, NP

## 2024-09-28 NOTE — Patient Instructions (Addendum)
 Stop by the lab prior to leaving today. I will notify you of your results once received.    Refill sent to the pharmacy.   You will either be contacted via phone regarding your referral to neurology , or you may receive a letter on your MyChart portal from our referral team with instructions for scheduling an appointment. Please let us  know if you have not been contacted by anyone within two weeks.   Follow up in 4 weeks.   It was a pleasure to meet you today! Please don't hesitate to contact me with any questions. Welcome to [facility]!

## 2024-09-28 NOTE — Assessment & Plan Note (Signed)
 Smoking cessation instruction/counseling given:  counseled patient on the dangers of tobacco use, advised patient to stop smoking, and reviewed strategies to maximize success

## 2024-09-28 NOTE — Assessment & Plan Note (Signed)
 EMR reviewed briefly.   Release sent for records from [facility] and Dr. Aliene.

## 2024-09-29 ENCOUNTER — Ambulatory Visit: Admitting: Family Medicine

## 2024-09-29 ENCOUNTER — Ambulatory Visit: Payer: Self-pay | Admitting: General Practice

## 2024-09-29 LAB — HEPATITIS C ANTIBODY: Hepatitis C Ab: NONREACTIVE

## 2024-09-29 LAB — HIV ANTIBODY (ROUTINE TESTING W REFLEX)
HIV 1&2 Ab, 4th Generation: NONREACTIVE
HIV FINAL INTERPRETATION: NEGATIVE

## 2024-09-29 LAB — HEMOGLOBIN A1C: Hgb A1c MFr Bld: 4.5 % — ABNORMAL LOW (ref 4.6–6.5)

## 2024-10-28 ENCOUNTER — Ambulatory Visit: Payer: Self-pay

## 2024-10-28 ENCOUNTER — Ambulatory Visit: Payer: Self-pay | Admitting: General Practice

## 2024-10-28 DIAGNOSIS — Z0000 Encounter for general adult medical examination without abnormal findings: Secondary | ICD-10-CM

## 2024-10-28 DIAGNOSIS — Z91199 Patient's noncompliance with other medical treatment and regimen due to unspecified reason: Secondary | ICD-10-CM

## 2024-10-28 NOTE — Progress Notes (Signed)
 Patient no-showed today's appointment.

## 2024-10-28 NOTE — Telephone Encounter (Signed)
" ° °  FYI Only or Action Required?: FYI only for provider: appointment scheduled on 12.23.25.  Patient was last seen in primary care on 09/28/2024 by Vincente Shivers, NP.  Called Nurse Triage reporting Diarrhea.  Symptoms began yesterday.  Interventions attempted: Nothing.  Symptoms are: unchanged.  Triage Disposition: Home Care  Patient/caregiver understands and will follow disposition?: [place] from [facility] sent at 10/28/2024  8:33 AM EST  Reason for Triage: Patient with uncontrolled diarrhea since last night   Reason for Disposition  MILD-MODERATE diarrhea (e.g., 1-6 times / day more than normal)  Answer Assessment - Initial Assessment Questions 1. DIARRHEA SEVERITY: How bad is the diarrhea? How many more stools have you had in the past 24 hours than normal?       3-4 times x 24 hours  2. ONSET: When did the diarrhea begin?       Last night  3. STOOL DESCRIPTION:  How loose or watery is the diarrhea? What is the stool color? Is there any blood or mucous in the stool?      Loose  4. VOMITING: Are you also vomiting? If Yes, ask: How many times in the past 24 hours?       Denies  5. ABDOMEN PAIN: Are you having any abdomen pain? If Yes, ask: What does it feel like? (e.g., crampy, dull, intermittent, constant)       Denies  6. ABDOMEN PAIN SEVERITY: If present, ask: How bad is the pain?  (e.g., Scale 1-10; mild, moderate, or severe)      Denies  7. ORAL INTAKE: If vomiting, Have you been able to drink liquids? How much liquids have you had in the past 24 hours?        8. HYDRATION: Any signs of dehydration? (e.g., dry mouth [not just dry lips], too weak to stand, dizziness, new weight loss) When did you last urinate?      Still drinking fluids  9. EXPOSURE: Have you traveled to a foreign country recently? Have you been exposed to anyone with diarrhea? Could you have eaten any food that was spoiled?      Denies  10. ANTIBIOTIC USE:  Are you taking antibiotics now or have you taken antibiotics in the past 2 months?        Denies  11. OTHER SYMPTOMS: Pt's mother denies fever, blood in stool  Pt reports diarrhea Pt's mother advised to make sure pt is drinking plenty of fluids Pt scheduled for a visit on  12.23.25  for further evaluation. Pt agrees with plan of care, will call back for any worsening symptoms  Protocols used: Diarrhea-A-AH  "

## 2024-10-28 NOTE — Telephone Encounter (Signed)
 Attempted to call pt x1. Unable to leave VM. Will attempt to contact pt at a later time.   Message from [facility] sent at 10/28/2024  8:33 AM EST  Reason for Triage: Patient with uncontrolled diarrhea since last night

## 2024-10-28 NOTE — Telephone Encounter (Signed)
 Noted. Patient had an appointment earlier today but did not show.   Appreciate Charles Johnson seeing him.

## 2024-10-28 NOTE — Telephone Encounter (Signed)
 Noted, will evaluate.

## 2024-11-01 ENCOUNTER — Ambulatory Visit: Admitting: Primary Care

## 2024-11-11 ENCOUNTER — Ambulatory Visit (INDEPENDENT_AMBULATORY_CARE_PROVIDER_SITE_OTHER)

## 2024-11-11 VITALS — Ht 67.5 in | Wt 194.0 lb

## 2024-11-11 DIAGNOSIS — Z0000 Encounter for general adult medical examination without abnormal findings: Secondary | ICD-10-CM

## 2024-11-11 NOTE — Progress Notes (Signed)
 "  Please attest and cosign this visit due to patients primary care provider not being in the office at the time the visit was completed.     Chief Complaint  Patient presents with   Medicare Wellness     Subjective:   Charles Johnson is a 54 y.o. male who presents for a Medicare Annual Wellness Visit.  Visit info / Clinical Intake: Medicare Wellness Visit Type:: Initial Annual Wellness Visit Persons participating in visit and providing information:: patient Medicare Wellness Visit Mode:: Telephone If telephone:: video declined Since this visit was completed virtually, some vitals may be partially provided or unavailable. Missing vitals are due to the limitations of the virtual format.: Unable to obtain vitals - no equipment If Telephone or Video please confirm:: I connected with patient using audio/video enable telemedicine. I verified patient identity with two identifiers, discussed telehealth limitations, and patient agreed to proceed. Patient Location:: home Provider Location:: office Interpreter Needed?: No Pre-visit prep was completed: yes AWV questionnaire completed by patient prior to visit?: no Living arrangements:: with family/others Patient's Overall Health Status Rating: (!) fair Typical amount of pain: some Does pain affect daily life?: (!) yes Are you currently prescribed opioids?: no  Dietary Habits and Nutritional Risks How many meals a day?: 3 Eats fruit and vegetables daily?: yes Most meals are obtained by: preparing own meals In the last 2 weeks, have you had any of the following?: none Diabetic:: no  Functional Status Activities of Daily Living (to include ambulation/medication): Independent Ambulation: Independent Medication Administration: Independent Home Management (perform basic housework or laundry): Independent Manage your own finances?: (!) no Primary transportation is: family / friends Concerns about vision?: no *vision screening is required  for WTM* Concerns about hearing?: no  Fall Screening Falls in the past year?: 0 Number of falls in past year: 0 Was there an injury with Fall?: 0 Fall Risk Category Calculator: 0 Patient Fall Risk Level: Low Fall Risk  Fall Risk Patient at Risk for Falls Due to: No Fall Risks Fall risk Follow up: Falls evaluation completed; Education provided; Falls prevention discussed  Home and Transportation Safety: All rugs have non-skid backing?: N/A, no rugs All stairs or steps have railings?: (!) no Grab bars in the bathtub or shower?: yes Have non-skid surface in bathtub or shower?: yes Good home lighting?: yes Regular seat belt use?: yes Hospital stays in the last year:: no  Cognitive Assessment Difficulty concentrating, remembering, or making decisions? : no Will 6CIT or Mini Cog be Completed: yes What year is it?: 0 points What month is it?: 0 points Give patient an address phrase to remember (5 components): [address] About what time is it?: 0 points Count backwards from 20 to 1: 0 points Say the months of the year in reverse: 4 points Repeat the address phrase from earlier: 8 points 6 CIT Score: 12 points  Advance Directives (For Healthcare) Does Patient Have a Medical Advance Directive?: Yes Does patient want to make changes to medical advance directive?: No - Patient declined Type of Advance Directive: Healthcare Power of [place]; Living will Copy of Healthcare Power of Attorney in Chart?: Yes - validated most recent copy scanned in chart (See row information) Copy of Living Will in Chart?: Yes - validated most recent copy scanned in chart (See row information)  Reviewed/Updated  Reviewed/Updated: Reviewed All (Medical, Surgical, Family, Medications, Allergies, Care Teams, Patient Goals)    Allergies (verified) Patient has no known allergies.   Current Medications (verified) Outpatient  Encounter Medications as of 11/11/2024  Medication Sig   ARIPiprazole   (ABILIFY ) 10 MG tablet Take 1 tablet (10 mg total) by mouth daily.   atorvastatin  (LIPITOR) 20 MG tablet Take 1 tablet (20 mg total) by mouth daily.   hydrochlorothiazide  (HYDRODIURIL ) 12.5 MG tablet Take 1 tablet (12.5 mg total) by mouth daily.   No facility-administered encounter medications on file as of 11/11/2024.    History: Past Medical History:  Diagnosis Date   Arthritis    Bipolar 1 disorder (HCC)    Clotting disorder    Depression    Primary hypertension 09/28/2024   Sleep apnea    Stroke [facility])    Past Surgical History:  Procedure Laterality Date   EYE SURGERY     Traumatic   head surgery     Family History  Problem Relation Age of Onset   Heart disease Maternal Grandmother    Hypertension Maternal Grandmother    Diabetes Maternal Grandmother    Heart failure Maternal Grandmother    Early death Father    Hypertension Maternal Uncle    Social History   Occupational History   Occupation: Disability    Comment: Bipolar  Tobacco Use   Smoking status: Some Days    Current packs/day: 0.50    Average packs/day: 0.5 packs/day for 30.0 years (15.0 ttl pk-yrs)    Types: Cigarettes   Smokeless tobacco: Never  Substance and Sexual Activity   Alcohol use: Yes    Alcohol/week: 3.0 standard drinks of alcohol    Types: 3 Standard drinks or equivalent per week    Comment: 2 times a week   Drug use: Yes    Types: Marijuana    Comment: occasionally   Sexual activity: Yes   Tobacco Counseling Ready to quit: Not Answered Counseling given: Not Answered  SDOH Screenings   Food Insecurity: No Food Insecurity (11/11/2024)  Housing: Unknown (11/11/2024)  Transportation Needs: No Transportation Needs (11/11/2024)  Utilities: Not At Risk (11/11/2024)  Alcohol Screen: Low Risk (11/11/2024)  Depression (PHQ2-9): Low Risk (11/11/2024)  Financial Resource Strain: Medium Risk (11/11/2024)  Physical Activity: Sufficiently Active (11/11/2024)  Social Connections: Moderately Isolated  (11/11/2024)  Stress: No Stress Concern Present (11/11/2024)  Recent Concern: Stress - Stress Concern Present (09/27/2024)  Tobacco Use: High Risk (11/11/2024)  Health Literacy: Adequate Health Literacy (11/11/2024)   See flowsheets for full screening details  Depression Screen PHQ 2 & 9 Depression Scale- Over the past 2 weeks, how often have you been bothered by any of the following problems? Little interest or pleasure in doing things: 0 Feeling down, depressed, or hopeless (PHQ Adolescent also includes...irritable): 0 PHQ-2 Total Score: 0 Trouble falling or staying asleep, or sleeping too much: 0 Feeling tired or having little energy: 0 Poor appetite or overeating (PHQ Adolescent also includes...weight loss): 0 Feeling bad about yourself - or that you are a failure or have let yourself or your family down: 0 Trouble concentrating on things, such as reading the newspaper or watching television (PHQ Adolescent also includes...like school work): 0 Moving or speaking so slowly that other people could have noticed. Or the opposite - being so fidgety or restless that you have been moving around a lot more than usual: 0 Thoughts that you would be better off dead, or of hurting yourself in some way: 0 PHQ-9 Total Score: 0 If you checked off any problems, how difficult have these problems made it for you to do your work, take care of things at home, or  get along with other people?: Not difficult at all     Goals Addressed             This Visit's Progress    Patient Stated       I would like decrease and manage pain better             Objective:    Today's Vitals   11/11/24 0816  Weight: 194 lb (88 kg)  Height: 5' 7.5 (1.715 m)   Body mass index is 29.94 kg/m.  Hearing/Vision screen Vision Screening - Comments:: UTD w/visits to Dr Patrcia Immunizations and Health Maintenance Health Maintenance  Topic Date Due   Hepatitis B Vaccines 19-59 Average Risk (1 of 3 - 19+ 3-dose  series) Never done   Colonoscopy  Never done   Zoster Vaccines- Shingrix (1 of 2) 12/29/2024 (Originally 11/24/2019)   DTaP/Tdap/Td (1 - Tdap) 09/28/2025 (Originally 11/23/1988)   Pneumococcal Vaccine: 50+ Years (1 of 2 - PCV) 09/28/2025 (Originally 11/23/1988)   COVID-19 Vaccine (6 - 2025-26 season) 10/13/2026 (Originally 07/11/2024)   Medicare Annual Wellness (AWV)  11/11/2025   Influenza Vaccine  Completed   Hepatitis C Screening  Completed   HIV Screening  Completed   HPV VACCINES  Aged Out   Meningococcal B Vaccine  Aged Out        Assessment/Plan:  This is a routine wellness examination for [place].  Patient Care Team: Vincente Shivers, NP as PCP - General (General Practice)  I have personally reviewed and noted the following in the patients chart:   Medical and social history Use of alcohol, tobacco or illicit drugs  Current medications and supplements including opioid prescriptions. Functional ability and status Nutritional status Physical activity Advanced directives List of other physicians Hospitalizations, surgeries, and ER visits in previous 12 months Vitals Screenings to include cognitive, depression, and falls Referrals and appointments  No orders of the defined types were placed in this encounter.  In addition, I have reviewed and discussed with patient certain preventive protocols, quality metrics, and best practice recommendations. A written personalized care plan for preventive services as well as general preventive health recommendations were provided to patient.   Charles LITTIE Saris, LPN   06/16/7972   Return in 1 year (on 11/11/2025) for annual wellness.  After Visit Summary: (MyChart) Due to this being a telephonic visit, the after visit summary with patients personalized plan was offered to patient via MyChart   Nurse Notes: No voiced or noted concerns at this time Patient advised to keep follow-up appointment with PCP (11/21/24) Appointment(s) made:  (AWV/CPE Jasn 2027)  "

## 2024-11-11 NOTE — Patient Instructions (Signed)
 Charles Johnson,  Thank you for taking the time for your Medicare Wellness Visit. I appreciate your continued commitment to your health goals. Please review the care plan we discussed, and feel free to reach out if I can assist you further.  Please note that Annual Wellness Visits do not include a physical exam. Some assessments may be limited, especially if the visit was conducted virtually. If needed, we may recommend an in-person follow-up with your provider.  Ongoing Care Seeing your primary care provider every 3 to 6 months helps us  monitor your health and provide consistent, personalized care.   Referrals If a referral was made during today's visit and you haven't received any updates within two weeks, please contact the referred provider directly to check on the status.  Recommended Screenings:  Health Maintenance  Topic Date Due   Medicare Annual Wellness Visit  Never done   Hepatitis B Vaccine (1 of 3 - 19+ 3-dose series) Never done   Colon Cancer Screening  Never done   Zoster (Shingles) Vaccine (1 of 2) 12/29/2024*   DTaP/Tdap/Td vaccine (1 - Tdap) 09/28/2025*   Pneumococcal Vaccine for age over 88 (1 of 2 - PCV) 09/28/2025*   COVID-19 Vaccine (6 - 2025-26 season) 10/13/2026*   Flu Shot  Completed   Hepatitis C Screening  Completed   HIV Screening  Completed   HPV Vaccine  Aged Out   Meningitis B Vaccine  Aged Out  *Topic was postponed. The date shown is not the original due date.       11/11/2024    8:21 AM  Advanced Directives  Does Patient Have a Medical Advance Directive? Yes  Type of Estate Agent of [place];Living will  Does patient want to make changes to medical advance directive? No - Patient declined  Copy of Healthcare Power of Attorney in Chart? Yes - validated most recent copy scanned in chart (See row information)    Vision: Annual vision screenings are recommended for early detection of glaucoma, cataracts, and diabetic retinopathy.  These exams can also reveal signs of chronic conditions such as diabetes and high blood pressure.  Dental: Annual dental screenings help detect early signs of oral cancer, gum disease, and other conditions linked to overall health, including heart disease and diabetes.

## 2024-11-17 ENCOUNTER — Ambulatory Visit: Admitting: Nurse Practitioner

## 2024-11-21 ENCOUNTER — Ambulatory Visit: Payer: Self-pay | Admitting: General Practice

## 2024-11-21 DIAGNOSIS — Z91199 Patient's noncompliance with other medical treatment and regimen due to unspecified reason: Secondary | ICD-10-CM

## 2024-11-21 NOTE — Progress Notes (Signed)
 Patient no-showed today's appointment; appointment was for physical.

## 2024-11-30 ENCOUNTER — Ambulatory Visit (INDEPENDENT_AMBULATORY_CARE_PROVIDER_SITE_OTHER): Admitting: Nurse Practitioner

## 2024-11-30 ENCOUNTER — Ambulatory Visit: Payer: Self-pay

## 2024-11-30 VITALS — BP 126/80 | HR 87 | Temp 98.4°F | Ht 67.5 in | Wt 197.8 lb

## 2024-11-30 DIAGNOSIS — L0201 Cutaneous abscess of face: Secondary | ICD-10-CM | POA: Diagnosis not present

## 2024-11-30 MED ORDER — DOXYCYCLINE HYCLATE 100 MG PO TABS: 100.0000 mg | ORAL_TABLET | Freq: Two times a day (BID) | ORAL | 0 refills | Status: AC

## 2024-11-30 NOTE — Patient Instructions (Signed)
 Nice to see you today  I have sent antibiotics to the pharmacy  If it does not start improving let the office know  You can use warm compresses 2-3 times a day on it to help it drain

## 2024-11-30 NOTE — Progress Notes (Signed)
 "  Established Patient Office Visit  Subjective   Patient ID: Charles Johnson, male    DOB: May 01, 1971  Age: 54 y.o. MRN: [id number]  Chief Complaint  Patient presents with   Abscess    Pt complains of possible abscess on right side of jaw. States ongoing for 5 days. Pt complains of pain and pus with slight tingling sensation.     Discussed the use of AI scribe software for clinical note transcription with the patient, who gave verbal consent to proceed.  With histroy of bipolar, tobacco abuse, cva, HTN History of Present Illness Charles Johnson is a 54 year old male who presents with a hard, painful spot on his jaw.  Approximately five days ago, he noticed a hard spot on his jaw, described as a 'knot' that has since ruptured slightly, with white pus expressed from it yesterday and this morning. The area feels warm. No dental pain, recent shaving, fever, chills, trouble swallowing, breathing, or talking. He experiences a mild headache.  His current medications include atorvastatin  for cholesterol, hydrochlorothiazide  for blood pressure, and Abilify . He has been using warm compresses to manage the condition and has been taking Tylenol  for discomfort, although it is not very effective.      Review of Systems  Constitutional:  Negative for chills and fever.  HENT:         Negative for diff swallowing, talking, or breathing   Respiratory:  Negative for shortness of breath.   Cardiovascular:  Negative for chest pain.  Skin:        + lesion  Neurological:  Positive for headaches.      Objective:     BP 126/80   Pulse 87   Temp 98.4 F (36.9 C) (Oral)   Ht 5' 7.5 (1.715 m)   Wt 197 lb 12.8 oz (89.7 kg)   SpO2 98%   BMI 30.52 kg/m    Physical Exam Vitals and nursing note reviewed.  Constitutional:      Appearance: Normal appearance.  HENT:     Head:      Mouth/Throat:     Mouth: Mucous membranes are moist.     Dentition: Dental caries present. No dental  tenderness, dental abscesses or gum lesions.     Pharynx: Oropharynx is clear.  Cardiovascular:     Rate and Rhythm: Normal rate and regular rhythm.     Heart sounds: Normal heart sounds.  Pulmonary:     Effort: Pulmonary effort is normal.     Breath sounds: Normal breath sounds.  Neurological:     Mental Status: He is alert.      No results found for any visits on 11/30/24.    The ASCVD Risk score (Arnett DK, et al., 2019) failed to calculate for the following reasons:   Risk score cannot be calculated because patient has a medical history suggesting prior/existing ASCVD   * - Cholesterol units were assumed    Assessment & Plan:   Problem List Items Addressed This Visit   None Visit Diagnoses       Facial abscess    -  Primary   Relevant Medications   doxycycline  (VIBRA -TABS) 100 MG tablet      Assessment and Plan Assessment & Plan Cutaneous abscess of face Cutaneous abscess on jaw with induration and drainage. No dental involvement. Risk of spreading infection. - Prescribed antibiotics twice daily for one week. - Advised warm compresses to facilitate drainage. - Instructed to avoid shaving until the  abscess heals. - Advised to monitor for signs of spreading infection and seek urgent care if these occur. - Recommended Tylenol  for discomfort. - Provided educational materials on abscesses. - Instructed to call the office if no improvement by Friday or if fever and chills develop.   Return if symptoms worsen or fail to improve.    Adina Crandall, NP  "

## 2024-11-30 NOTE — Telephone Encounter (Signed)
 Patient coming in to see Adina Crandall, NP today

## 2024-11-30 NOTE — Telephone Encounter (Signed)
Noted. Will see at appointment.  

## 2024-11-30 NOTE — Telephone Encounter (Signed)
 FYI Only or Action Required?: FYI only for provider: appointment scheduled on 11/30/24.  Patient was last seen in primary care on 09/28/2024 by Vincente Shivers, NP.  Called Nurse Triage reporting Abscess.  Symptoms began several days ago.  Interventions attempted: Nothing.  Symptoms are: unchanged.  Triage Disposition: See Physician Within 24 Hours, See HCP Within 4 Hours (Or PCP Triage)  Patient/caregiver understands and will follow disposition?: Yes    Message from [facility] S sent at 11/30/2024  8:34 AM EST  Reason for Triage: cyst- rt side with discharge , bleeding, and pain   Reason for Disposition  SEVERE pain (e.g., excruciating)  [1] Boil > 1/2 inch across (> 12 mm; larger than a marble) AND [2] center is soft or pus colored  Answer Assessment - Initial Assessment Questions Scheduled 11/30/24 Advised call back or ED/911 if symptoms occur/worsen: severe diff breathing, chest pain > 5 min, faint; redness spreads/streaks, swelling,changes mental status, severe pain/HA. Patient verbalized understanding.   1. APPEARANCE of BOIL: What does the boil look like?      Open, draining blood and pus, odor; redness around wound; denies redness spreading, streaks, swelling 2. LOCATION: Where is the boil located?      Right side of jaw 3. NUMBER: How many boils are there?      1 4. SIZE: How big is the boil? (e.g., inches, cm; compare to size of a coin or other object)     Size quarter 5. ONSET: When did the boil start?     4 days, open today 6. PAIN: Is there any pain? If Yes, ask: How bad is the pain?   (Scale 1-10; or mild, moderate, severe)     9/10 comes and goes 7. FEVER: Do you have a fever? If Yes, ask: What is it, how was it measured, and when did it start?    nausea  Denies fever chills vomiting 8. SOURCE: Have you been around anyone with boils or other Staph infections? Have you ever had boils before?     no 9. OTHER SYMPTOMS: Do you have any other  symptoms? (e.g., shaking chills, weakness, rash elsewhere on body) Denies change mental status, dizziness, diff breathing, weakness, rash, red streaks, swelling  Protocols used: Boil (Skin Abscess)-A-AH

## 2024-11-30 NOTE — Telephone Encounter (Signed)
 Noted. Thank you Matt for seeing this patient.

## 2024-12-02 ENCOUNTER — Encounter: Admitting: General Practice

## 2024-12-05 ENCOUNTER — Other Ambulatory Visit: Payer: Self-pay | Admitting: General Practice

## 2024-12-05 DIAGNOSIS — E785 Hyperlipidemia, unspecified: Secondary | ICD-10-CM

## 2024-12-05 DIAGNOSIS — F3160 Bipolar disorder, current episode mixed, unspecified: Secondary | ICD-10-CM

## 2024-12-14 ENCOUNTER — Ambulatory Visit: Admitting: General Practice

## 2024-12-14 ENCOUNTER — Encounter: Payer: Self-pay | Admitting: General Practice

## 2024-12-14 VITALS — BP 128/82 | HR 86 | Temp 97.8°F | Ht 67.5 in | Wt 201.0 lb

## 2024-12-14 DIAGNOSIS — R259 Unspecified abnormal involuntary movements: Secondary | ICD-10-CM

## 2024-12-14 DIAGNOSIS — Z0000 Encounter for general adult medical examination without abnormal findings: Secondary | ICD-10-CM | POA: Insufficient documentation

## 2024-12-14 DIAGNOSIS — Z125 Encounter for screening for malignant neoplasm of prostate: Secondary | ICD-10-CM

## 2024-12-14 DIAGNOSIS — Z716 Tobacco abuse counseling: Secondary | ICD-10-CM

## 2024-12-14 DIAGNOSIS — F3160 Bipolar disorder, current episode mixed, unspecified: Secondary | ICD-10-CM

## 2024-12-14 DIAGNOSIS — Z23 Encounter for immunization: Secondary | ICD-10-CM

## 2024-12-14 DIAGNOSIS — E785 Hyperlipidemia, unspecified: Secondary | ICD-10-CM

## 2024-12-14 DIAGNOSIS — I10 Essential (primary) hypertension: Secondary | ICD-10-CM

## 2024-12-14 LAB — PSA: PSA: 2.25 ng/mL (ref 0.10–4.00)

## 2024-12-14 MED ORDER — HYDROCHLOROTHIAZIDE 12.5 MG PO TABS: 12.5000 mg | ORAL_TABLET | Freq: Every day | ORAL | 1 refills | Status: AC

## 2024-12-14 MED ORDER — ATORVASTATIN CALCIUM 20 MG PO TABS: 20.0000 mg | ORAL_TABLET | Freq: Every day | ORAL | 0 refills | Status: AC

## 2024-12-14 NOTE — Assessment & Plan Note (Signed)
Scheduled to see neurology

## 2024-12-14 NOTE — Patient Instructions (Addendum)
 Stop by the lab prior to leaving today. I will notify you of your results once received.   Please follow up with neurology in March.   Please follow up with psych doctor for Abilify . Let me know if you have trouble getting that filled.  Cut back on smoking.  Schedule tetanus shot and shingles shot at the pharmacy.  Follow up in 6 months.   It was a pleasure to see you today!

## 2024-12-14 NOTE — Assessment & Plan Note (Addendum)
 At goal.   Continue hydrochlorothiazide  12.5 mg once daily.  F/u in 6 months.

## 2024-12-14 NOTE — Assessment & Plan Note (Signed)
 Smoking cessation instruction/counseling given:  counseled patient on the dangers of tobacco use, advised patient to stop smoking, and reviewed strategies to maximize success

## 2024-12-14 NOTE — Assessment & Plan Note (Signed)
 Controlled.  Continue Atorvastatin  20 mg once daily.  F/u in 6 months.

## 2024-12-14 NOTE — Assessment & Plan Note (Signed)
 Following with mental health in [place].  Has an appt this month.   Continue Abilify  10  mg once daily.  He will start getting his prescription from his psychiatrist.

## 2024-12-14 NOTE — Assessment & Plan Note (Signed)
 Immunizations tdap and shingrix- due; will schedule at pharmacy. Prevnar 20 given today. Colonoscopy UTD, [facility]. PSA due and pending.  Discussed the importance of a healthy diet and regular exercise in order for weight loss, and to reduce the risk of further co-morbidity.  Exam stable. Labs pending.  Follow up in 1 year for repeat physical.

## 2024-12-14 NOTE — Progress Notes (Signed)
 "  Established Patient Office Visit  Subjective   Patient ID: Charles Johnson, male    DOB: 10-29-1971  Age: 54 y.o. MRN: [id number]  Chief Complaint  Patient presents with   Annual Exam    HPI  Charles Johnson is a 54 year old male with past medical history of HTN, bipolar 1 diosrder, HLD, tobacco abuse, abnormal involuntary movements presents today for complete physical and follow up of chronic conditions.  Immunizations: -Tetanus: due -Influenza: completed -Shingles: due -Pneumonia: due  Diet: Fair diet.  Exercise: No regular exercise.  Eye exam: Completes annually  Dental exam: Completes semi-annually    Colonoscopy: Completed in 2025  PSA: Due   Patient Active Problem List   Diagnosis Date Noted   Encounter for screening and preventative care 12/14/2024   History of stroke 09/28/2024   Primary hypertension 09/28/2024   Hyperlipidemia 09/28/2024   Tobacco abuse counseling 09/28/2024   Abnormal involuntary movements 09/28/2024   Vaccine counseling 09/28/2024   Hemorrhoids 05/28/2015   Mixed bipolar I disorder (HCC) 05/28/2015   Syncope 05/11/2015   Past Medical History:  Diagnosis Date   Arthritis    Bipolar 1 disorder (HCC)    Clotting disorder    Depression    Primary hypertension 09/28/2024   Sleep apnea    Stroke [facility])    Past Surgical History:  Procedure Laterality Date   EYE SURGERY     Traumatic   head surgery     Allergies[1]       12/14/2024   11:49 AM 11/30/2024   12:02 PM 11/11/2024    8:19 AM  Depression screen PHQ 2/9  Decreased Interest 0 2 0  Down, Depressed, Hopeless 2 2 0  PHQ - 2 Score 2 4 0  Altered sleeping 0 2   Tired, decreased energy 2 2   Change in appetite 0 1   Feeling bad or failure about yourself  0 2   Trouble concentrating 0 2   Moving slowly or fidgety/restless 0 2   Suicidal thoughts 0 1   PHQ-9 Score 4 16   Difficult doing work/chores Somewhat difficult Somewhat difficult        12/14/2024   11:50 AM  11/30/2024   12:02 PM 09/28/2024    9:19 AM  GAD 7 : Generalized Anxiety Score  Nervous, Anxious, on Edge 2 2 0   Control/stop worrying 0 2 0   Worry too much - different things 2 2 0   Trouble relaxing 2 2 0   Restless 2 2 0   Easily annoyed or irritable 0 2 0   Afraid - awful might happen 0 2 0   Total GAD 7 Score 8 14 0  Anxiety Difficulty Somewhat difficult Somewhat difficult Not difficult at all     Data saved with a previous flowsheet row definition      Review of Systems  Constitutional:  Negative for chills, fever, malaise/fatigue and weight loss.  HENT:  Negative for congestion, ear discharge, ear pain, hearing loss, nosebleeds, sinus pain, sore throat and tinnitus.   Eyes:  Negative for blurred vision, double vision, pain, discharge and redness.  Respiratory:  Negative for cough, shortness of breath, wheezing and stridor.   Cardiovascular:  Negative for chest pain, palpitations and leg swelling.  Gastrointestinal:  Negative for abdominal pain, constipation, diarrhea, heartburn, nausea and vomiting.  Genitourinary:  Negative for dysuria, frequency and urgency.  Musculoskeletal:  Negative for myalgias.  Skin:  Negative for rash.  Neurological:  Negative for dizziness, tingling, seizures, weakness and headaches.  Psychiatric/Behavioral:  Negative for depression, substance abuse and suicidal ideas. The patient is not nervous/anxious.       Objective:     BP 128/82   Pulse 86   Temp 97.8 F (36.6 C) (Temporal)   Ht 5' 7.5 (1.715 m)   Wt 201 lb (91.2 kg)   SpO2 98%   BMI 31.02 kg/m  BP Readings from Last 3 Encounters:  12/14/24 128/82  11/30/24 126/80  09/28/24 122/80   Wt Readings from Last 3 Encounters:  12/14/24 201 lb (91.2 kg)  11/30/24 197 lb 12.8 oz (89.7 kg)  11/11/24 194 lb (88 kg)      Physical Exam Vitals and nursing note reviewed.  Constitutional:      Appearance: Normal appearance.  HENT:     Head: Normocephalic and atraumatic.     Right  Ear: Tympanic membrane, ear canal and external ear normal.     Left Ear: Tympanic membrane, ear canal and external ear normal.     Nose: Nose normal.     Mouth/Throat:     Mouth: Mucous membranes are moist.     Pharynx: Oropharynx is clear.  Eyes:     Conjunctiva/sclera: Conjunctivae normal.     Pupils: Pupils are equal, round, and reactive to light.  Cardiovascular:     Rate and Rhythm: Normal rate and regular rhythm.     Pulses: Normal pulses.     Heart sounds: Normal heart sounds.  Pulmonary:     Effort: Pulmonary effort is normal.     Breath sounds: Normal breath sounds.  Abdominal:     General: Abdomen is flat. Bowel sounds are normal.     Palpations: Abdomen is soft.  Musculoskeletal:        General: Normal range of motion.     Cervical back: Normal range of motion.  Skin:    General: Skin is warm and dry.     Capillary Refill: Capillary refill takes less than 2 seconds.  Neurological:     General: No focal deficit present.     Mental Status: He is alert and oriented to person, place, and time. Mental status is at baseline.  Psychiatric:        Mood and Affect: Mood normal.        Behavior: Behavior normal.        Thought Content: Thought content normal.        Judgment: Judgment normal.      No results found for any visits on 12/14/24.     The ASCVD Risk score (Arnett DK, et al., 2019) failed to calculate for the following reasons:   Risk score cannot be calculated because patient has a medical history suggesting prior/existing ASCVD   * - Cholesterol units were assumed    Assessment & Plan:  Encounter for screening and preventative care Assessment & Plan: Immunizations tdap and shingrix- due; will schedule at pharmacy. Prevnar 20 given today. Colonoscopy UTD, [facility]. PSA due and pending.  Discussed the importance of a healthy diet and regular exercise in order for weight loss, and to reduce the risk of further  co-morbidity.  Exam stable. Labs pending.  Follow up in 1 year for repeat physical.    Prostate cancer screening -     PSA  Hyperlipidemia, unspecified hyperlipidemia type Assessment & Plan: Controlled.  Continue Atorvastatin  20 mg once daily.  F/u in 6 months.  Orders: -  Atorvastatin  Calcium ; Take 1 tablet (20 mg total) by mouth daily.  Dispense: 90 tablet; Refill: 0  Primary hypertension Assessment & Plan: At goal.   Continue hydrochlorothiazide  12.5 mg once daily.  F/u in 6 months.  Orders: -     hydroCHLOROthiazide ; Take 1 tablet (12.5 mg total) by mouth daily.  Dispense: 90 tablet; Refill: 1  Mixed bipolar I disorder (HCC) Assessment & Plan: Following with mental health in [place].  Has an appt this month.   Continue Abilify  10  mg once daily.  He will start getting his prescription from his psychiatrist.   Tobacco abuse counseling Assessment & Plan: Smoking cessation instruction/counseling given:  counseled patient on the dangers of tobacco use, advised patient to stop smoking, and reviewed strategies to maximize success    Abnormal involuntary movements Assessment & Plan: Scheduled to see neurology.    Need for pneumococcal 20-valent conjugate vaccination -     Pneumococcal conjugate vaccine 20-valent     Return in about 6 months (around 06/13/2025) for HTN, HLD.    Carrol Aurora, NP    [1] No Known Allergies  "

## 2024-12-15 ENCOUNTER — Ambulatory Visit: Payer: Self-pay | Admitting: General Practice

## 2024-12-15 NOTE — Telephone Encounter (Signed)
 Spoke with patients mom Ronal; okay per DPR. Myles Ronal that patient should be on this with his history of stroke and that rx was sent back in to his pharmacy. Ronal states she will get him back on the medication.

## 2024-12-28 ENCOUNTER — Ambulatory Visit: Admitting: Neurology

## 2025-01-12 ENCOUNTER — Ambulatory Visit: Admitting: Neurology

## 2025-06-13 ENCOUNTER — Ambulatory Visit: Admitting: General Practice

## 2025-11-28 ENCOUNTER — Encounter: Admitting: General Practice

## 2025-11-28 ENCOUNTER — Ambulatory Visit
